# Patient Record
Sex: Female | Born: 1955 | Race: White | Hispanic: Yes | Marital: Married | Smoking: Never smoker
Health system: Southern US, Community
[De-identification: ages and names within clinical notes are randomized; demographics above are authoritative.]

## PROBLEM LIST (undated history)

## (undated) DIAGNOSIS — I10 Essential (primary) hypertension: Secondary | ICD-10-CM

## (undated) HISTORY — PX: ABDOMINAL HYSTERECTOMY: SHX81

## (undated) HISTORY — PX: TUBAL LIGATION: SHX77

---

## 2019-03-04 ENCOUNTER — Emergency Department (HOSPITAL_BASED_OUTPATIENT_CLINIC_OR_DEPARTMENT_OTHER)
Admission: EM | Admit: 2019-03-04 | Discharge: 2019-03-04 | Disposition: A | Discharge status: Home / Self Care | Payer: Self-pay | Attending: Emergency Medicine | Admitting: Emergency Medicine

## 2019-03-04 ENCOUNTER — Encounter (HOSPITAL_BASED_OUTPATIENT_CLINIC_OR_DEPARTMENT_OTHER): Payer: Self-pay | Admitting: Emergency Medicine

## 2019-03-04 ENCOUNTER — Other Ambulatory Visit: Payer: Self-pay

## 2019-03-04 ENCOUNTER — Emergency Department (HOSPITAL_BASED_OUTPATIENT_CLINIC_OR_DEPARTMENT_OTHER): Payer: Self-pay

## 2019-03-04 DIAGNOSIS — I10 Essential (primary) hypertension: Secondary | ICD-10-CM | POA: Insufficient documentation

## 2019-03-04 DIAGNOSIS — S60812A Abrasion of left wrist, initial encounter: Secondary | ICD-10-CM | POA: Diagnosis present

## 2019-03-04 DIAGNOSIS — Y999 Unspecified external cause status: Secondary | ICD-10-CM | POA: Insufficient documentation

## 2019-03-04 DIAGNOSIS — S51021A Laceration with foreign body of right elbow, initial encounter: Secondary | ICD-10-CM | POA: Insufficient documentation

## 2019-03-04 DIAGNOSIS — S80212A Abrasion, left knee, initial encounter: Secondary | ICD-10-CM | POA: Insufficient documentation

## 2019-03-04 DIAGNOSIS — S52532A Colles' fracture of left radius, initial encounter for closed fracture: Secondary | ICD-10-CM | POA: Insufficient documentation

## 2019-03-04 DIAGNOSIS — W19XXXA Unspecified fall, initial encounter: Secondary | ICD-10-CM

## 2019-03-04 DIAGNOSIS — W010XXA Fall on same level from slipping, tripping and stumbling without subsequent striking against object, initial encounter: Secondary | ICD-10-CM | POA: Insufficient documentation

## 2019-03-04 DIAGNOSIS — Y9289 Other specified places as the place of occurrence of the external cause: Secondary | ICD-10-CM | POA: Insufficient documentation

## 2019-03-04 DIAGNOSIS — Y93K1 Activity, walking an animal: Secondary | ICD-10-CM | POA: Insufficient documentation

## 2019-03-04 DIAGNOSIS — S51011A Laceration without foreign body of right elbow, initial encounter: Secondary | ICD-10-CM

## 2019-03-04 DIAGNOSIS — M79603 Pain in arm, unspecified: Secondary | ICD-10-CM

## 2019-03-04 HISTORY — DX: Essential (primary) hypertension: I10

## 2019-03-04 MED ORDER — SODIUM CHLORIDE 0.9 % IV BOLUS
1000.0000 mL | Freq: Once | INTRAVENOUS | Status: AC
  Administered 2019-03-04: 1000 mL via INTRAVENOUS

## 2019-03-04 MED ORDER — HYDROCODONE-ACETAMINOPHEN 5-325 MG PO TABS: 1.0000 | ORAL_TABLET | Freq: Four times a day (QID) | ORAL | 0 refills | Status: DC | PRN

## 2019-03-04 MED ORDER — HYDROCODONE-ACETAMINOPHEN 5-325 MG PO TABS
2.0000 | ORAL_TABLET | Freq: Once | ORAL | Status: DC
  Filled 2019-03-04: qty 2

## 2019-03-04 MED ORDER — TETANUS-DIPHTH-ACELL PERTUSSIS 5-2.5-18.5 LF-MCG/0.5 IM SUSP
0.5000 mL | Freq: Once | INTRAMUSCULAR | Status: AC
  Administered 2019-03-04: 0.5 mL via INTRAMUSCULAR
  Filled 2019-03-04: qty 0.5

## 2019-03-04 MED ORDER — DOXYCYCLINE HYCLATE 100 MG PO CAPS: 100.0000 mg | ORAL_CAPSULE | Freq: Two times a day (BID) | ORAL | 0 refills | Status: AC

## 2019-03-04 MED ORDER — PROPOFOL 10 MG/ML IV BOLUS
1.0000 mg/kg | Freq: Once | INTRAVENOUS | Status: DC
  Filled 2019-03-04: qty 20

## 2019-03-04 MED ORDER — PROPOFOL 10 MG/ML IV BOLUS
INTRAVENOUS | Status: AC | PRN
  Administered 2019-03-04 (×2): 30 mg via INTRAVENOUS
  Administered 2019-03-04: 50 mg via INTRAVENOUS
  Administered 2019-03-04: 20 mg via INTRAVENOUS

## 2019-03-04 MED ORDER — LIDOCAINE HCL (PF) 1 % IJ SOLN
5.0000 mL | Freq: Once | INTRAMUSCULAR | Status: AC
  Administered 2019-03-04: 5 mL
  Filled 2019-03-04: qty 5

## 2019-03-04 MED ORDER — ACETAMINOPHEN 325 MG PO TABS
650.0000 mg | ORAL_TABLET | Freq: Once | ORAL | Status: AC
  Administered 2019-03-04: 650 mg via ORAL
  Filled 2019-03-04: qty 2

## 2019-03-04 NOTE — ED Notes (Signed)
Pt ambulatory to BR with steady gate. No complaints at this time.

## 2019-03-04 NOTE — Sedation Documentation (Signed)
Unable to rate pain due to sedation.  

## 2019-03-04 NOTE — Sedation Documentation (Signed)
Pt rates pain 5/10.

## 2019-03-04 NOTE — ED Provider Notes (Signed)
  Physical Exam  BP (!) 146/88 (BP Location: Right Arm)   Pulse 72   Temp 98.4 F (36.9 C) (Oral)   Resp 18   Ht 5\' 3"  (1.6 m)   Wt 59 kg   SpO2 100%   BMI 23.03 kg/m    .Sedation Date/Time: 03/04/2019 6:29 PM Performed by: Pricilla Loveless, MD Authorized by: Pricilla Loveless, MD   Consent:    Consent obtained:  Verbal and written   Consent given by:  Patient   Risks discussed:  Allergic reaction, dysrhythmia, inadequate sedation, nausea, prolonged hypoxia resulting in organ damage, prolonged sedation necessitating reversal, respiratory compromise necessitating ventilatory assistance and intubation and vomiting   Alternatives discussed:  Analgesia without sedation, anxiolysis and regional anesthesia Universal protocol:    Procedure explained and questions answered to patient or proxy's satisfaction: yes     Relevant documents present and verified: yes     Test results available and properly labeled: yes     Imaging studies available: yes     Required blood products, implants, devices, and special equipment available: yes     Site/side marked: yes     Immediately prior to procedure a time out was called: yes     Patient identity confirmation method:  Verbally with patient Indications:    Procedure necessitating sedation performed by:  Physician performing sedation Pre-sedation assessment:    Time since last food or drink:  3.5 hours   ASA classification: class 1 - normal, healthy patient     Neck mobility: normal     Mouth opening:  3 or more finger widths   Thyromental distance:  4 finger widths   Mallampati score:  I - soft palate, uvula, fauces, pillars visible   Pre-sedation assessments completed and reviewed: airway patency, cardiovascular function, hydration status, mental status, nausea/vomiting, pain level, respiratory function and temperature   Immediate pre-procedure details:    Reassessment: Patient reassessed immediately prior to procedure     Reviewed: vital signs,  relevant labs/tests and NPO status     Verified: bag valve mask available, emergency equipment available, intubation equipment available, IV patency confirmed, oxygen available and suction available   Procedure details (see MAR for exact dosages):    Preoxygenation:  Nasal cannula   Sedation:  Propofol   Intra-procedure monitoring:  Blood pressure monitoring, cardiac monitor, continuous pulse oximetry, frequent LOC assessments, frequent vital sign checks and continuous capnometry   Intra-procedure events: none     Total Provider sedation time (minutes):  13 Post-procedure details:    Attendance: Constant attendance by certified staff until patient recovered     Recovery: Patient returned to pre-procedure baseline     Post-sedation assessments completed and reviewed: airway patency, cardiovascular function, hydration status, mental status, nausea/vomiting, pain level, respiratory function and temperature     Patient is stable for discharge or admission: yes     Patient tolerance:  Tolerated well, no immediate complications    MDM  Patient with displaced wrist fracture. NV intact. Reduced with propofol sedation. See PA note for reduction.      Pricilla Loveless, MD 03/04/19 [phone]

## 2019-03-04 NOTE — Sedation Documentation (Signed)
Rates pain 5/10

## 2019-03-04 NOTE — ED Provider Notes (Signed)
[facility] EMERGENCY DEPARTMENT Provider Note   CSN: [id number] Arrival date & time: 03/04/19  1545    History   Chief Complaint Chief Complaint  Patient presents with  . Fall    HPI Maria Mccann is a 63 y.o. female with only no medical condition of hypertension presenting today for left wrist pain and deformity following fall.  Patient states that she was walking her dogs with her husband when she tripped over 1 of the dogs following onto her left wrist.  Patient reports that she had immediate throbbing pain and noted deformity and present immediately to the ER.  Patient reports that her fall occurred approximately 15 minutes prior to ER arrival.  She states her pain has improved since onset with rest and splint application by triage team.  Patient does also report laceration to the right elbow as well as abrasions of the left knee and abrasion of the left wrist.  Of note patient denies head injury, loss of consciousness, blood thinner use, pain of the back/neck, chest or abdomen.  She reports taking 2 ibuprofen prior to arrival with some improvement of her symptoms.     HPI  Past Medical History:  Diagnosis Date  . Hypertension     Patient Active Problem List   Diagnosis Date Noted  . Abrasion of left wrist 03/04/2019    Past Surgical History:  Procedure Laterality Date  . ABDOMINAL HYSTERECTOMY       OB History   No obstetric history on file.      Home Medications    Prior to Admission medications   Medication Sig Start Date End Date Taking? Authorizing Provider  doxycycline (VIBRAMYCIN) 100 MG capsule Take 1 capsule (100 mg total) by mouth 2 (two) times daily for 7 days. 03/04/19 03/11/19  Harlene Salts A, PA-C  HYDROcodone-acetaminophen (NORCO/VICODIN) 5-325 MG tablet Take 1-2 tablets by mouth every 6 (six) hours as needed. 03/04/19   Bill Salinas, PA-C    Family History No family history on file.  Social History Social History   Tobacco Use   . Smoking status: Never Smoker  . Smokeless tobacco: Never Used  Substance Use Topics  . Alcohol use: Not on file  . Drug use: Not on file     Allergies   Patient has no known allergies.   Review of Systems Review of Systems  Constitutional: Negative.  Negative for chills and fever.  Musculoskeletal: Positive for arthralgias (Left wrist). Negative for back pain and neck pain.  Skin: Positive for wound.  Neurological: Negative for weakness, numbness and headaches.  All other systems reviewed and are negative.  Physical Exam Updated Vital Signs BP (!) 150/96   Pulse 70   Temp 98.4 F (36.9 C) (Oral)   Resp 16   Ht 5\' 3"  (1.6 m)   Wt 59 kg   SpO2 98%   BMI 23.03 kg/m   Physical Exam Constitutional:      General: She is not in acute distress.    Appearance: Normal appearance. She is well-developed. She is not ill-appearing or diaphoretic.  HENT:     Head: Normocephalic and atraumatic. No raccoon eyes or Battle's sign.     Jaw: There is normal jaw occlusion. No trismus.     Right Ear: Tympanic membrane, ear canal and external ear normal. No hemotympanum.     Left Ear: Tympanic membrane, ear canal and external ear normal. No hemotympanum.     Nose: Nose normal.  Mouth/Throat:     Mouth: Mucous membranes are moist.     Pharynx: Oropharynx is clear.  Eyes:     General: Vision grossly intact. Gaze aligned appropriately.     Extraocular Movements: Extraocular movements intact.     Conjunctiva/sclera: Conjunctivae normal.     Pupils: Pupils are equal, round, and reactive to light.  Neck:     Musculoskeletal: Normal range of motion. No spinous process tenderness or muscular tenderness.     Trachea: Trachea and phonation normal. No tracheal tenderness or tracheal deviation.  Cardiovascular:     Rate and Rhythm: Normal rate and regular rhythm.     Pulses:          Radial pulses are 2+ on the right side and 2+ on the left side.  Pulmonary:     Effort: Pulmonary effort  is normal. No accessory muscle usage or respiratory distress.     Breath sounds: Normal air entry.  Chest:     Chest wall: No tenderness.  Abdominal:     General: There is no distension.     Palpations: Abdomen is soft.     Tenderness: There is no abdominal tenderness. There is no guarding or rebound.  Musculoskeletal:     Right elbow: She exhibits laceration. She exhibits normal range of motion, no swelling and no deformity. No tenderness found.     Left wrist: She exhibits decreased range of motion, tenderness and deformity.     Comments: No midline C/T/L spinal tenderness to palpation, no paraspinal muscle tenderness, no deformity, crepitus, or step-off noted. No sign of injury to the neck or back. - Pain deformity decreased range of motion of the left wrist appears as Colles' fracture.  Small abrasion overlying left wrist, superficial does not communicate with fracture.  See picture attached - 2 linear parallel lacerations of the right elbow.  See picture attached - Superficial abrasions of the left knee. - Patient with full range of motion and appropriate strength to movements of all major joints apart from left wrist.  Skin:    General: Skin is warm and dry.     Capillary Refill: Capillary refill takes less than 2 seconds.       Neurological:     Mental Status: She is alert.     GCS: GCS eye subscore is 4. GCS verbal subscore is 5. GCS motor subscore is 6.     Comments: Speech is clear and goal oriented, follows commands Major Cranial nerves without deficit, no facial droop Moves extremities without ataxia, coordination intact  Psychiatric:        Behavior: Behavior normal.          ED Treatments / Results  Labs (all labs ordered are listed, but only abnormal results are displayed) Labs Reviewed - No data to display  EKG None  Radiology Dg Elbow 2 Views Right  Result Date: 03/04/2019 CLINICAL DATA:  Abrasion after fall.  Rule out foreign body. EXAM: RIGHT  ELBOW - 2 VIEW COMPARISON:  None. FINDINGS: An IV and is associated tubing overlie the right elbow. The soft tissue injury appears to be posterior to the olecranon on the lateral view. There is a punctate focus of high attenuation in the wound which could represent a small foreign body. Soft tissues are otherwise normal. No fractures, dislocations, or joint effusions identified. IMPRESSION: Tiny rounded region of high attenuation in the wound posterior to the proximal ulna on the lateral view is suspicious for a tiny foreign body. No  fractures or effusions noted. Electronically Signed   By: Gerome Sam III M.D   On: 03/04/2019 18:29   Dg Wrist 2 Views Left  Result Date: 03/04/2019 CLINICAL DATA:  Post reduction films. Distal radius fracture reduction. EXAM: LEFT WRIST - 2 VIEW COMPARISON:  None. FINDINGS: The patient is now in a cast. The displaced and angulated distal radial fracture is stable. The ulnar styloid fracture is stable. No other interval changes. IMPRESSION: The patient has been placed in a cast. The angulated and displaced distal radius fracture and ulnar styloid fracture are stable. Electronically Signed   By: Gerome Sam III M.D   On: 03/04/2019 18:27   Dg Wrist Complete Left  Result Date: 03/04/2019 CLINICAL DATA:  Larey Seat onto outstretched LEFT arm while walking the dog today, deformity and pain EXAM: LEFT WRIST - COMPLETE 3+ VIEW COMPARISON:  None FINDINGS: Osseous demineralization. Transverse metaphyseal fracture distal LEFT radius with dorsal displacement and dorsal tilt of distal radial articular surface. Probable fracture of ulnar styloid, not significantly displaced. Joint spaces otherwise preserved. No additional fracture, dislocation or bone destruction. IMPRESSION: Transverse displaced and angulated distal LEFT radial metaphyseal fracture. Probable nondisplaced ulnar styloid fracture. Electronically Signed   By: Ulyses Southward M.D.   On: 03/04/2019 16:11    Procedures  .Marland KitchenLaceration Repair Date/Time: 03/04/2019 7:53 PM Performed by: Bill Salinas, PA-C Authorized by: Bill Salinas, PA-C   Consent:    Consent obtained:  Verbal   Consent given by:  Patient   Risks discussed:  Infection, need for additional repair, nerve damage, poor wound healing, poor cosmetic result, pain, retained foreign body, tendon damage and vascular damage Anesthesia (see MAR for exact dosages):    Anesthesia method:  Local infiltration   Local anesthetic:  Lidocaine 1% w/o epi Laceration details:    Location:  Shoulder/arm   Shoulder/arm location:  R elbow   Length (cm):  3   Depth (mm):  5 Repair type:    Repair type:  Simple Pre-procedure details:    Preparation:  Patient was prepped and draped in usual sterile fashion and imaging obtained to evaluate for foreign bodies Exploration:    Wound exploration: wound explored through full range of motion and entire depth of wound probed and visualized     Wound extent: foreign bodies/material     Wound extent: no muscle damage noted, no nerve damage noted, no tendon damage noted, no underlying fracture noted and no vascular damage noted     Foreign bodies/material:  Grit/Dirt Treatment:    Area cleansed with:  Betadine   Amount of cleaning:  Extensive   Irrigation solution:  Sterile saline   Irrigation volume:  1L   Irrigation method:  Pressure wash   Visualized foreign bodies/material removed: yes   Skin repair:    Repair method:  Sutures   Suture size:  4-0   Suture material:  Prolene   Suture technique:  Simple interrupted   Number of sutures:  2 Approximation:    Approximation:  Close Post-procedure details:    Dressing:  Antibiotic ointment, non-adherent dressing and sterile dressing   Patient tolerance of procedure:  Tolerated well, no immediate complications .Marland KitchenLaceration Repair Date/Time: 03/04/2019 7:55 PM Performed by: Bill Salinas, PA-C Authorized by: Bill Salinas, PA-C   Consent:     Consent obtained:  Verbal   Consent given by:  Patient   Risks discussed:  Infection, need for additional repair, nerve damage, poor wound healing, pain, poor cosmetic result, tendon  damage, vascular damage and retained foreign body Anesthesia (see MAR for exact dosages):    Anesthesia method:  Local infiltration   Local anesthetic:  Lidocaine 1% w/o epi Laceration details:    Location:  Shoulder/arm   Shoulder/arm location:  R elbow   Length (cm):  3   Depth (mm):  5 Repair type:    Repair type:  Simple Pre-procedure details:    Preparation:  Patient was prepped and draped in usual sterile fashion and imaging obtained to evaluate for foreign bodies Exploration:    Hemostasis achieved with:  Direct pressure   Wound exploration: wound explored through full range of motion and entire depth of wound probed and visualized     Wound extent: foreign bodies/material     Wound extent: no muscle damage noted, no nerve damage noted, no tendon damage noted, no underlying fracture noted and no vascular damage noted     Foreign bodies/material:  Dirt/grit Treatment:    Area cleansed with:  Betadine   Amount of cleaning:  Extensive   Irrigation solution:  Sterile saline   Irrigation volume:  1L   Irrigation method:  Pressure wash   Visualized foreign bodies/material removed: yes   Skin repair:    Repair method:  Sutures   Suture size:  4-0   Suture material:  Prolene   Suture technique:  Simple interrupted   Number of sutures:  2 Approximation:    Approximation:  Close Post-procedure details:    Dressing:  Antibiotic ointment, non-adherent dressing and sterile dressing   Patient tolerance of procedure:  Tolerated well, no immediate complications .Ortho Injury Treatment Date/Time: 03/04/2019 7:57 PM Performed by: Bill SalinasMorelli, Vinson Tietze A, PA-C Authorized by: Bill SalinasMorelli, Coco Sharpnack A, PA-C   Consent:    Consent obtained:  Verbal   Consent given by:  Patient   Risks discussed:  Fracture, nerve  damage, restricted joint movement, vascular damage, stiffness, recurrent dislocation and irreducible dislocationInjury location: wrist Location details: left wrist Injury type: fracture Fracture type: distal radius and ulnar styloid Pre-procedure neurovascular assessment: neurovascularly intact Pre-procedure distal perfusion: normal Pre-procedure neurological function: normal Pre-procedure range of motion: reduced  Anesthesia: Local anesthesia used: no  Patient sedated: Yes. Refer to sedation procedure documentation for details of sedation. Manipulation performed: yes Reduction successful: yes X-ray confirmed reduction: yes Immobilization: splint Splint type: sugar tong Post-procedure neurovascular assessment: post-procedure neurovascularly intact Post-procedure distal perfusion: normal Post-procedure neurological function: normal Post-procedure range of motion: unchanged Patient tolerance: Patient tolerated the procedure well with no immediate complications Comments: Procedural sedation performed by Dr. Criss AlvineGoldston.  Reduction supervised by Dr. Criss AlvineGoldston.  Marland Kitchen.Splint Application Date/Time: 03/04/2019 7:59 PM Performed by: Bill SalinasMorelli, Gwynneth Fabio A, PA-C Authorized by: Bill SalinasMorelli, Leonilda Cozby A, PA-C   Consent:    Consent obtained:  Verbal   Consent given by:  Patient   Risks discussed:  Numbness, discoloration, pain and swelling Pre-procedure details:    Sensation:  Normal   Skin color:  Normal Procedure details:    Laterality:  Left   Location:  Wrist   Wrist:  L wrist   Splint type:  Sugar tong Post-procedure details:    Pain:  Improved   Sensation:  Normal   Skin color:  Normal   Patient tolerance of procedure:  Tolerated well, no immediate complications Comments:     Applied with ED tech.   (including critical care time)  Medications Ordered in ED Medications  propofol (DIPRIVAN) 10 mg/mL bolus/IV push 59 mg (130 mg Intravenous See Procedure Record 03/04/19 1828)  Tdap (  BOOSTRIX)  injection 0.5 mL (0.5 mLs Intramuscular Given 03/04/19 1831)  sodium chloride 0.9 % bolus 1,000 mL (0 mLs Intravenous Stopped 03/04/19 1900)  lidocaine (PF) (XYLOCAINE) 1 % injection 5 mL (5 mLs Infiltration Given by Other 03/04/19 1815)  propofol (DIPRIVAN) 10 mg/mL bolus/IV push (30 mg Intravenous Given 03/04/19 1752)  acetaminophen (TYLENOL) tablet 650 mg (650 mg Oral Given 03/04/19 1825)   Initial Impression / Assessment and Plan / ED Course  I have reviewed the triage vital signs and the nursing notes.  Pertinent labs & imaging results that were available during my care of the patient were reviewed by me and considered in my medical decision making (see chart for details).  Clinical Course as of Mar 04 1951  Sat Mar 04, 2019  1636 Discussed case with Dr. Renetta Chalk, ortho hand, who has asked that we reduce here in ED and splint for follow-up in his office on Tuesday.   [BM]    Clinical Course User Index [BM] Bill Salinas, PA-C   63 year old female presents with left wrist fracture following fall just prior to arrival.  Abrasion overlying left ridge which is superficial and does not communicate with fracture.  Additionally with 2 lacerations of the right elbow.  Abrasions of the left knee.  Patient denies loss of consciousness, headache, blood thinner use or pain of the neck back chest or abdomen.  No signs of injury other than above.  Patient's left upper extremity is neurovascularly intact on arrival and strong radial pulse, capillary refill and sensation intact to all fingers, full range of motion to all fingers, reduced range of motion of left wrist secondary to fracture no pain of the left elbow or shoulder.  Patient does endorse a tingling sensation of the tip of the left index finger, sensation is intact in all distributions.  DG left wrist:  IMPRESSION: Transverse displaced and angulated distal LEFT radial metaphyseal fracture. Probable nondisplaced ulnar styloid fracture.   Patient's  case was discussed with hand surgeon Dr. Orlan Leavens who advised that we reduce fracture here in the emergency department placed in splint and have patient follow-up in his office on Tuesday.  Procedural sedation was performed by Dr. Criss Alvine and fracture was reduced without complication.  Patient was placed in splint.  Postreduction films obtained.  DG left wrist: IMPRESSION: The patient has been placed in a cast. The angulated and displaced distal radius fracture and ulnar styloid fracture are stable.   Postreduction films were reviewed with Dr. Criss Alvine, improved alignment, no further manipulation indicated at this time.  Postreduction and splinting patient neurovascularly intact capillary refill and sensation is intact to all fingers in all distributions.  Patient reports that the tingling sensation of the left index finger has improved.  Plan of care regarding the left wrist fracture is to provide patient with small course of Norco for her acute fracture, narcotic precautions discussed with the patient and she states understanding.  Rice therapy and orthopedic follow-up on Tuesday, referral given.  Patient counseled on splint care. --------------- As to patient's to patient's two linear parallel right elbow lacerations.   DG right elbow:  IMPRESSION: Tiny rounded region of high attenuation in the wound posterior to the proximal ulna on the lateral view is suspicious for a tiny foreign body. No fractures or effusions noted.   Patient reports that she has not had tetanus shot in greater than 10 years, Tdap updated today.  Wounds were thoroughly cleaned here in the emergency department and multiple bits of  debris were removed from the lacerations.  Lacerations repaired as dictated above.  Based on mechanism and amount of contamination of these lacerations doxycycline was prescribed for infection prophylaxis.  Encourage patient to follow-up for wound recheck in 3-4 days and informed of need for suture  removal in 7 days.  Post laceration repair patient is with full range of motion of the right elbow without pain or dehiscence.  Dressed by nursing staff. - As to patient's left knee abrasions and superficial she has full range of motion and strength no bony tenderness, effusion or pain with range of motion, no imaging indicated of this area. - Patient counseled on home wound care. Follow up with PCP/urgent care or return to ER for suture removal in 7 days. Patient was urged to return to the Emergency Department for worsening pain, swelling, expanding erythema especially if it streaks away from the affected area, fever, or for any additional concerns. - At this time there does not appear to be any evidence of an acute emergency medical condition and the patient appears stable for discharge with appropriate outpatient follow up. Diagnosis was discussed with patient who verbalizes understanding of care plan and is agreeable to discharge. I have discussed return precautions with patient who verbalize understanding of return precautions. Patient encouraged to follow-up with their PCP and ortho. All questions answered.  Patient has been discharged in good condition.  Patient was seen and evaluated by Dr. Criss Alvine during this visit, case rediscussed with Dr. Criss Alvine at discharge who agrees with plan of care, discharge with primary and Ortho follow-up.   Note: Portions of this report may have been transcribed using voice recognition software. Every effort was made to ensure accuracy; however, inadvertent computerized transcription errors may still be present. Final Clinical Impressions(s) / ED Diagnoses   Final diagnoses:  Closed Colles' fracture of left radius, initial encounter  Elbow laceration, right, initial encounter  Abrasion of left wrist, initial encounter  Abrasion, left knee, initial encounter  Fall, initial encounter    ED Discharge Orders         Ordered    HYDROcodone-acetaminophen  (NORCO/VICODIN) 5-325 MG tablet  Every 6 hours PRN     03/04/19 1944    doxycycline (VIBRAMYCIN) 100 MG capsule  2 times daily     03/04/19 1948           Elizabeth Palau 03/04/19 2118    Pricilla Loveless, MD 03/04/19 2119

## 2019-03-04 NOTE — ED Notes (Signed)
Pt awakw and alert, speaking to family by phone

## 2019-03-04 NOTE — ED Notes (Signed)
Pt's son updated on plan of care (sedation and reduction of wrist fracture) per pt request

## 2019-03-04 NOTE — ED Triage Notes (Signed)
Pt tripped over her dog and fell injuring L wrist, deformity noted. Pt has strong pulse and neuro function is intact. Card board splint applied at triage. Denies LOC.

## 2019-03-04 NOTE — ED Notes (Signed)
ED Provider at bedside. Dr. Goldston 

## 2019-03-04 NOTE — Discharge Instructions (Addendum)
You have been diagnosed today with fracture of the left wrist with dislocation.  2 lacerations of the right elbow.  Abrasions of the left wrist and left knee.  At this time there does not appear to be the presence of an emergent medical condition, however there is always the potential for conditions to change. Please read and follow the below instructions.  Please return to the Emergency Department immediately for any new or worsening symptoms. Please be sure to follow up with your Primary Care Provider within one week regarding your visit today; please call their office to schedule an appointment even if you are feeling better for a follow-up visit. The hand surgeon Dr. Orlan Leavensrtman is expecting to see you in his office on Tuesday, 03/07/2019.  Please call his office on Monday to confirm your appointment.  Your wrist will need to be treated by the hand surgeon to ensure proper healing and restoration of function.  You may use the pain medication Norco as prescribed to help with severe pain; do not drive or operate heavy machinery or drink alcohol while taking Norco as it will make you drowsy.  Additionally rest, ice and elevating your left wrist will help with pain and swelling. Your 2 lacerations on your left elbow have been repaired with a total of 4 stitches.  These stitches will need to be removed in 7 days they may be removed by your primary care provider, the urgent care, the hand specialist or here at the emergency department.  Please take the antibiotic doxycycline as prescribed to avoid infection.  Follow-up is important for wound recheck.  Please change her dressing daily and monitor for signs of infection including redness, swelling, pain, drainage and fever.  Return to the emergency department if signs of infection develop.  Get help right away if: You have very bad swelling around your wound. You have pus or a bad smell coming from your wound. Your pain suddenly gets worse and is very bad. You  have painful lumps near your wound or anywhere on your body. You have a red streak going away from your wound. The wound is on your hand or foot, and: You cannot move a finger or toe as you used to do. Your fingers or toes look pale or blue. You have numbness that spreads down your hand, foot, fingers, or toes. Get help right away if: You cannot move your fingers. You have severe pain. Your fingers or your hand: Become numb, cold, or pale. Turn a bluish color. Get help right away if: Your pain is getting worse. The injured area tingles, becomes numb, or turns cold and blue. The part of your body above or below the cast is swollen and discolored. You cannot feel or move your fingers or toes. There is fluid leaking through the cast. You have severe pain or pressure under the cast. You have trouble breathing. You have shortness of breath. You have chest pain.  Please read the additional information packets attached to your discharge summary.  Do not take your medicine if  develop an itchy rash, swelling in your mouth or lips, or difficulty breathing.  --- Below has been translated using Google translate.  Errors may be present.  Interpret with caution.  ========= A continuacin se ha traducido AutoNationutilizando Google translate. Los errores The Timken Companypueden estar presentes. Interpretar con precaucin. ---- Vito BackersHoy le han diagnosticado fractura de la mueca izquierda con luxacin. 2 laceraciones del codo derecho. Abrasiones de la [place]ca izquierda y la rodilla izquierda.  En este momento no parece existir la presencia de una condicin mdica emergente, sin embargo, siempre existe la posibilidad de que las condiciones [place]. Lea y siga las instrucciones a continuacin.  1. Regrese al Departamento de emergencias de inmediato por cualquier sntoma nuevo o que empeore. 2. Asegrese de hacer un seguimiento con su proveedor de atencin primaria dentro de una semana con respecto a su visita de hoy; llame a su  oficina para programar una cita, incluso si se siente mejor para una visita de seguimiento. 3. El cirujano de [place] Dr. Salina April verte en su oficina el Karolee Stamps 03/07/2019. Llame a su oficina el lunes para confirmar su cita. El cirujano de la mano deber tratar Renne Crigler para garantizar una curacin [place] y la restauracin de la funcin. Puede usar el medicamento para Child psychotherapist segn lo prescrito para ayudar con el dolor intenso; No conduzca ni maneje maquinaria pesada ni beba alcohol mientras est tomando Norco, ya que lo adormecer. Adems, descansar, hielo y elevar la mueca izquierda ayudarn con el dolor y la hinchazn. 4. Sus 2 laceraciones en el codo izquierdo han sido reparadas con un total de 4 puntos. Estos puntos de sutura debern retirarse en 7 das; su proveedor de atencin primaria, la atencin de [place], Pensions consultant en manos o el servicio de urgencias pueden retirarlos. Tome el antibitico doxiciclina como se lo recetaron para evitar infecciones. El seguimiento es importante para volver a IT consultant herida. Cambie su vendaje diariamente y controle si hay signos de infeccin, como enrojecimiento, hinchazn, dolor, drenaje y [place]. Regrese al departamento de emergencias si se desarrollan signos de infeccin.  Obtenga ayuda de inmediato si: ? Tiene una hinchazn muy fuerte alrededor de la herida. ? Tiene pus o un mal olor proveniente de su herida. ? Su dolor de repente [place] y es [address] ? Tiene bultos dolorosos cerca de su herida o en cualquier parte de su cuerpo. ? Tiene una raya roja que se aleja de su herida. ? La herida est en su mano o pie y: o No puede mover un dedo o un dedo del pie como sola hacerlo. o Sus dedos de manos y pies se ven plidos o azules. o Usted tiene entumecimiento que se extiende por su mano, pie, dedos o dedos de los pies. Obtenga ayuda de inmediato si: ? No puedes mover tus dedos. ? Statistician. ? Tus dedos o tu mano: o Se  vuelve entumecido, fro o plido. o Gire un color azulado. Obtenga ayuda de inmediato si: ? Tu dolor est empeorando. ? El rea lesionada hormiguea, se adormece o se vuelve fra y [place]. ? La parte de su cuerpo encima o debajo del yeso est hinchada y descolorida. ? No puede sentir ni mover los dedos de las manos o los pies. ? Se est escapando lquido a travs del yeso. ? Tiene dolor intenso o presin debajo del yeso. ? Tienes problemas para respirar. ? Tienes dificultad para respirar. ? Tienes Journalist, newspaper.  Lea los paquetes de informacin adicional adjuntos a su resumen de alta.  No tome su medicamento si desarrolla una erupcin cutnea con picazn, hinchazn en la boca o los labios, o dificultad para respirar.

## 2019-03-04 NOTE — ED Notes (Signed)
Report given to [facility], [place]

## 2019-03-07 ENCOUNTER — Other Ambulatory Visit: Payer: Self-pay

## 2019-03-07 ENCOUNTER — Encounter (HOSPITAL_BASED_OUTPATIENT_CLINIC_OR_DEPARTMENT_OTHER): Payer: Self-pay | Admitting: *Deleted

## 2019-03-07 NOTE — H&P (Addendum)
  Maria Mccann is an 63 y.o. female.   Chief Complaint: LEFT WRIST INJURY  HPI: The patient is a 63 y/o right hand dominant female who fell on 03/04/19 causing an injury to the left wrist. She was seen at [facility] for initial treatment. She was put into a sugartong splint and sling.  She has had continued numbness, tingling, swelling, and stiffness since the injury. Discussed the reason and rationale for surgery.  The patient is here today for surgery.  She denies chest pain, shortness of breath, fever, chills, nausea, vomiting, or diarrhea.    Past Medical History:  Diagnosis Date  . Hypertension     Past Surgical History:  Procedure Laterality Date  . ABDOMINAL HYSTERECTOMY      No family history on file. Social History:  reports that she has never smoked. She has never used smokeless tobacco. No history on file for alcohol and drug.  Allergies: No Known Allergies  No medications prior to admission.    No results found for this or any previous visit (from the past 48 hour(s)). No results found.  ROS NO RECENT ILLNESSES OR HOSPITALIZATIONS  There were no vitals taken for this visit. Physical Exam  General Appearance:  Alert, cooperative, no distress, appears stated age  Head:  Normocephalic, without obvious abnormality, atraumatic  Eyes:  Pupils equal, conjunctiva/corneas clear,         Throat: Lips, mucosa, and tongue normal; teeth and gums normal  Neck: No visible masses     Lungs:   respirations unlabored  Chest Wall:  No tenderness or deformity  Heart:  Regular rate and rhythm,  Abdomen:   Soft, non-tender,         Extremities:   Pulses: 2+ and symmetric  Skin: Skin color, texture, turgor normal, no rashes or lesions     Neurologic: Normal    Assessment LEFT DISTAL RADIUS FRACTURE,DISPLACED ANGULATED, INTRAARTICULAR   Plan LEFT DISTAL RADIUS OPEN REDUCTION AND INTERNAL FIXATION WITH REPAIR AS INDICATED   WE ARE PLANNING SURGERY FOR YOUR  UPPER EXTREMITY. THE RISKS AND BENEFITS OF SURGERY INCLUDE BUT NOT LIMITED TO BLEEDING INFECTION, DAMAGE TO NEARBY NERVES ARTERIES TENDONS, FAILURE OF SURGERY TO ACCOMPLISH ITS INTENDED GOALS, PERSISTENT SYMPTOMS AND NEED FOR FURTHER SURGICAL INTERVENTION. WITH THIS IN MIND WE WILL PROCEED. I HAVE DISCUSSED WITH THE PATIENT THE PRE AND POSTOPERATIVE REGIMEN AND THE DOS AND DON'TS. PT VOICED UNDERSTANDING AND INFORMED CONSENT SIGNED.  R/B/A DISCUSSED WITH PT IN OFFICE.  PT VOICED UNDERSTANDING OF PLAN CONSENT SIGNED DAY OF SURGERY PT SEEN AND EXAMINED PRIOR TO OPERATIVE PROCEDURE/DAY OF SURGERY SITE MARKED. QUESTIONS ANSWERED WILL GO HOME FOLLOWING SURGERY  Dana Dorner [facility] MD 03/08/19  Karma Greaser 03/07/2019, 3:14 PM

## 2019-03-08 ENCOUNTER — Ambulatory Visit (HOSPITAL_BASED_OUTPATIENT_CLINIC_OR_DEPARTMENT_OTHER)
Admission: RE | Admit: 2019-03-08 | Discharge: 2019-03-08 | Disposition: A | Discharge status: Home / Self Care | Payer: Self-pay | Attending: Orthopedic Surgery | Admitting: Orthopedic Surgery

## 2019-03-08 ENCOUNTER — Ambulatory Visit (HOSPITAL_BASED_OUTPATIENT_CLINIC_OR_DEPARTMENT_OTHER): Payer: Self-pay | Admitting: Anesthesiology

## 2019-03-08 ENCOUNTER — Encounter (HOSPITAL_BASED_OUTPATIENT_CLINIC_OR_DEPARTMENT_OTHER): Payer: Self-pay | Admitting: *Deleted

## 2019-03-08 ENCOUNTER — Encounter (HOSPITAL_BASED_OUTPATIENT_CLINIC_OR_DEPARTMENT_OTHER)
Admission: RE | Disposition: A | Discharge status: Home / Self Care | Payer: Self-pay | Source: Home / Self Care | Attending: Orthopedic Surgery

## 2019-03-08 DIAGNOSIS — W19XXXA Unspecified fall, initial encounter: Secondary | ICD-10-CM | POA: Insufficient documentation

## 2019-03-08 DIAGNOSIS — I10 Essential (primary) hypertension: Secondary | ICD-10-CM | POA: Insufficient documentation

## 2019-03-08 DIAGNOSIS — S52502A Unspecified fracture of the lower end of left radius, initial encounter for closed fracture: Secondary | ICD-10-CM

## 2019-03-08 DIAGNOSIS — S52572A Other intraarticular fracture of lower end of left radius, initial encounter for closed fracture: Secondary | ICD-10-CM | POA: Insufficient documentation

## 2019-03-08 HISTORY — PX: OPEN REDUCTION INTERNAL FIXATION (ORIF) DISTAL RADIAL FRACTURE: SHX5989

## 2019-03-08 SURGERY — OPEN REDUCTION INTERNAL FIXATION (ORIF) DISTAL RADIUS FRACTURE
Anesthesia: General | Site: Wrist | Laterality: Left

## 2019-03-08 MED ORDER — FENTANYL CITRATE (PF) 100 MCG/2ML IJ SOLN
INTRAMUSCULAR | Status: AC
  Filled 2019-03-08: qty 2

## 2019-03-08 MED ORDER — MIDAZOLAM HCL 2 MG/2ML IJ SOLN
INTRAMUSCULAR | Status: AC
  Filled 2019-03-08: qty 2

## 2019-03-08 MED ORDER — OXYCODONE HCL 5 MG/5ML PO SOLN: 5.0000 mg | Freq: Once | ORAL | Status: DC | PRN

## 2019-03-08 MED ORDER — MEPERIDINE HCL 25 MG/ML IJ SOLN: 6.2500 mg | INTRAMUSCULAR | Status: DC | PRN

## 2019-03-08 MED ORDER — ACETAMINOPHEN 325 MG PO TABS: 325.0000 mg | ORAL_TABLET | ORAL | Status: DC | PRN

## 2019-03-08 MED ORDER — SCOPOLAMINE 1 MG/3DAYS TD PT72: 1.0000 | MEDICATED_PATCH | Freq: Once | TRANSDERMAL | Status: DC | PRN

## 2019-03-08 MED ORDER — ONDANSETRON HCL 4 MG/2ML IJ SOLN: 4.0000 mg | Freq: Once | INTRAMUSCULAR | Status: DC | PRN

## 2019-03-08 MED ORDER — FENTANYL CITRATE (PF) 100 MCG/2ML IJ SOLN: 25.0000 ug | INTRAMUSCULAR | Status: DC | PRN

## 2019-03-08 MED ORDER — EPHEDRINE SULFATE 50 MG/ML IJ SOLN
INTRAMUSCULAR | Status: DC | PRN
  Administered 2019-03-08: 10 mg via INTRAVENOUS

## 2019-03-08 MED ORDER — CEFAZOLIN SODIUM-DEXTROSE 2-4 GM/100ML-% IV SOLN: 2.0000 g | INTRAVENOUS | Status: DC

## 2019-03-08 MED ORDER — MIDAZOLAM HCL 2 MG/2ML IJ SOLN
1.0000 mg | INTRAMUSCULAR | Status: DC | PRN
  Administered 2019-03-08: 2 mg via INTRAVENOUS

## 2019-03-08 MED ORDER — OXYCODONE HCL 5 MG PO TABS: 5.0000 mg | ORAL_TABLET | Freq: Once | ORAL | Status: DC | PRN

## 2019-03-08 MED ORDER — ONDANSETRON HCL 4 MG/2ML IJ SOLN
INTRAMUSCULAR | Status: DC | PRN
  Administered 2019-03-08: 4 mg via INTRAVENOUS

## 2019-03-08 MED ORDER — PHENYLEPHRINE HCL (PRESSORS) 10 MG/ML IV SOLN
INTRAVENOUS | Status: DC | PRN
Start: 2019-03-08 — End: 2019-03-08
  Administered 2019-03-08 (×5): 120 ug via INTRAVENOUS

## 2019-03-08 MED ORDER — FENTANYL CITRATE (PF) 100 MCG/2ML IJ SOLN
50.0000 ug | INTRAMUSCULAR | Status: DC | PRN
  Administered 2019-03-08: 50 ug via INTRAVENOUS

## 2019-03-08 MED ORDER — LACTATED RINGERS IV SOLN
INTRAVENOUS | Status: DC
  Administered 2019-03-08: 10 mL/h via INTRAVENOUS

## 2019-03-08 MED ORDER — PROPOFOL 10 MG/ML IV BOLUS
INTRAVENOUS | Status: DC | PRN
  Administered 2019-03-08: 200 mg via INTRAVENOUS

## 2019-03-08 MED ORDER — ACETAMINOPHEN 160 MG/5ML PO SOLN: 325.0000 mg | ORAL | Status: DC | PRN

## 2019-03-08 MED ORDER — LIDOCAINE HCL (CARDIAC) PF 100 MG/5ML IV SOSY
PREFILLED_SYRINGE | INTRAVENOUS | Status: DC | PRN
  Administered 2019-03-08: 200 mg via INTRAVENOUS

## 2019-03-08 MED ORDER — BUPIVACAINE-EPINEPHRINE (PF) 0.5% -1:200000 IJ SOLN
INTRAMUSCULAR | Status: DC | PRN
  Administered 2019-03-08: 20 mL via PERINEURAL

## 2019-03-08 MED ORDER — CEFAZOLIN SODIUM-DEXTROSE 2-3 GM-%(50ML) IV SOLR
INTRAVENOUS | Status: DC | PRN
Start: 2019-03-08 — End: 2019-03-08
  Administered 2019-03-08: 2 g via INTRAVENOUS

## 2019-03-08 MED ORDER — CHLORHEXIDINE GLUCONATE 4 % EX LIQD: 60.0000 mL | Freq: Once | CUTANEOUS | Status: DC

## 2019-03-08 MED ORDER — DEXAMETHASONE SODIUM PHOSPHATE 10 MG/ML IJ SOLN
INTRAMUSCULAR | Status: DC | PRN
  Administered 2019-03-08: 5 mg via INTRAVENOUS

## 2019-03-08 MED ORDER — BUPIVACAINE LIPOSOME 1.3 % IJ SUSP
INTRAMUSCULAR | Status: DC | PRN
  Administered 2019-03-08: 10 mL via PERINEURAL

## 2019-03-08 MED ORDER — PROPOFOL 10 MG/ML IV BOLUS
INTRAVENOUS | Status: AC
  Filled 2019-03-08: qty 40

## 2019-03-08 SURGICAL SUPPLY — 74 items
BANDAGE ACE 3X5.8 VEL STRL LF (GAUZE/BANDAGES/DRESSINGS) ×3 IMPLANT
BANDAGE ACE 4X5 VEL STRL LF (GAUZE/BANDAGES/DRESSINGS) ×3 IMPLANT
BIT DRILL 2.2 SS TIBIAL (BIT) ×3 IMPLANT
BLADE SURG 15 STRL LF DISP TIS (BLADE) ×2 IMPLANT
BLADE SURG 15 STRL SS (BLADE) ×4
BNDG ESMARK 4X9 LF (GAUZE/BANDAGES/DRESSINGS) ×3 IMPLANT
BNDG GAUZE ELAST 4 BULKY (GAUZE/BANDAGES/DRESSINGS) ×3 IMPLANT
CANISTER SUCT 1200ML W/VALVE (MISCELLANEOUS) IMPLANT
CORD BIPOLAR FORCEPS 12FT (ELECTRODE) ×3 IMPLANT
COVER BACK TABLE REUSABLE LG (DRAPES) ×3 IMPLANT
COVER WAND RF STERILE (DRAPES) IMPLANT
CUFF TOURN SGL QUICK 18X4 (TOURNIQUET CUFF) ×3 IMPLANT
DECANTER SPIKE VIAL GLASS SM (MISCELLANEOUS) IMPLANT
DRAPE EXTREMITY T 121X128X90 (DISPOSABLE) ×3 IMPLANT
DRAPE OEC MINIVIEW 54X84 (DRAPES) ×3 IMPLANT
DRSG EMULSION OIL 3X3 NADH (GAUZE/BANDAGES/DRESSINGS) ×3 IMPLANT
GAUZE SPONGE 4X4 12PLY STRL (GAUZE/BANDAGES/DRESSINGS) ×3 IMPLANT
GLOVE BIO SURGEON STRL SZ8 (GLOVE) ×3 IMPLANT
GLOVE BIOGEL PI IND STRL 6.5 (GLOVE) ×2 IMPLANT
GLOVE BIOGEL PI IND STRL 7.5 (GLOVE) ×1 IMPLANT
GLOVE BIOGEL PI IND STRL 8 (GLOVE) ×1 IMPLANT
GLOVE BIOGEL PI IND STRL 8.5 (GLOVE) ×1 IMPLANT
GLOVE BIOGEL PI INDICATOR 6.5 (GLOVE) ×4
GLOVE BIOGEL PI INDICATOR 7.5 (GLOVE) ×2
GLOVE BIOGEL PI INDICATOR 8 (GLOVE) ×2
GLOVE BIOGEL PI INDICATOR 8.5 (GLOVE) ×2
GLOVE ECLIPSE 6.5 STRL STRAW (GLOVE) ×6 IMPLANT
GLOVE SURG ORTHO 8.0 STRL STRW (GLOVE) ×3 IMPLANT
GOWN STRL REUS W/ TWL XL LVL3 (GOWN DISPOSABLE) ×1 IMPLANT
GOWN STRL REUS W/TWL XL LVL3 (GOWN DISPOSABLE) ×2
K-WIRE 1.6 (WIRE) ×2
K-WIRE FX5X1.6XNS BN SS (WIRE) ×1
KWIRE FX5X1.6XNS BN SS (WIRE) ×1 IMPLANT
NEEDLE HYPO 25X1 1.5 SAFETY (NEEDLE) IMPLANT
NS IRRIG 1000ML POUR BTL (IV SOLUTION) ×3 IMPLANT
PACK BASIN DAY SURGERY FS (CUSTOM PROCEDURE TRAY) ×3 IMPLANT
PAD CAST 4YDX4 CTTN HI CHSV (CAST SUPPLIES) ×2 IMPLANT
PADDING CAST ABS 4INX4YD NS (CAST SUPPLIES) ×2
PADDING CAST ABS COTTON 4X4 ST (CAST SUPPLIES) ×1 IMPLANT
PADDING CAST COTTON 4X4 STRL (CAST SUPPLIES) ×4
PEG LOCKING SMOOTH 2.2X16 (Screw) ×3 IMPLANT
PEG LOCKING SMOOTH 2.2X18 (Peg) ×3 IMPLANT
PEG LOCKING SMOOTH 2.2X20 (Screw) ×9 IMPLANT
PEG LOCKING SMOOTH 2.2X22 (Screw) ×6 IMPLANT
PLATE STANDARD DVR LEFT (Plate) ×3 IMPLANT
PLATE STD DVR LT 24X51 (Plate) ×1 IMPLANT
SCREW LOCK 14X2.7X 3 LD TPR (Screw) ×3 IMPLANT
SCREW LOCKING 2.7X13MM (Screw) ×6 IMPLANT
SCREW LOCKING 2.7X14 (Screw) ×6 IMPLANT
SLEEVE SCD COMPRESS KNEE MED (MISCELLANEOUS) ×3 IMPLANT
SLING ARM FOAM STRAP LRG (SOFTGOODS) IMPLANT
SLING ARM MED ADULT FOAM STRAP (SOFTGOODS) ×3 IMPLANT
SPLINT FIBERGLASS 3X35 (CAST SUPPLIES) ×3 IMPLANT
SPLINT FIBERGLASS 4X30 (CAST SUPPLIES) IMPLANT
STOCKINETTE 4X48 STRL (DRAPES) ×3 IMPLANT
SUCTION FRAZIER HANDLE 10FR (MISCELLANEOUS) ×2
SUCTION TUBE FRAZIER 10FR DISP (MISCELLANEOUS) ×1 IMPLANT
SUT MNCRL AB 3-0 PS2 18 (SUTURE) IMPLANT
SUT MON AB 3-0 SH 27 (SUTURE)
SUT MON AB 3-0 SH27 (SUTURE) IMPLANT
SUT PROLENE 3 0 PS 1 (SUTURE) IMPLANT
SUT PROLENE 4 0 PS 2 18 (SUTURE) ×3 IMPLANT
SUT VIC AB 0 CT1 27 (SUTURE) ×2
SUT VIC AB 0 CT1 27XBRD ANBCTR (SUTURE) ×1 IMPLANT
SUT VIC AB 2-0 PS2 27 (SUTURE) ×3 IMPLANT
SUT VIC AB 2-0 SH 27 (SUTURE)
SUT VIC AB 2-0 SH 27XBRD (SUTURE) IMPLANT
SUT VICRYL 4-0 PS2 18IN ABS (SUTURE) ×3 IMPLANT
SYR BULB 3OZ (MISCELLANEOUS) ×3 IMPLANT
SYR CONTROL 10ML LL (SYRINGE) IMPLANT
TOWEL GREEN STERILE FF (TOWEL DISPOSABLE) ×3 IMPLANT
TUBE CONNECTING 20'X1/4 (TUBING) ×1
TUBE CONNECTING 20X1/4 (TUBING) ×2 IMPLANT
UNDERPAD 30X30 (UNDERPADS AND DIAPERS) ×3 IMPLANT

## 2019-03-08 NOTE — Anesthesia Procedure Notes (Signed)
Anesthesia Regional Block: Interscalene brachial plexus block   Pre-Anesthetic Checklist: ,, timeout performed, Correct Patient, Correct Site, Correct Laterality, Correct Procedure, Correct Position, site marked, Risks and benefits discussed,  Surgical consent,  Pre-op evaluation,  At surgeon's request and post-op pain management  Laterality: Left  Prep: chloraprep       Needles:  Injection technique: Single-shot  Needle Type: Echogenic Stimulator Needle     Needle Length: 5cm  Needle Gauge: 22     Additional Needles:   Procedures:, nerve stimulator,,, ultrasound used (permanent image in chart),,,,   Nerve Stimulator or Paresthesia:  Response: hand, 0.45 mA,   Additional Responses:   Narrative:  Start time: 03/08/2019 12:30 PM End time: 03/08/2019 12:35 PM Injection made incrementally with aspirations every 5 mL.  Performed by: Personally  Anesthesiologist: Bethena Midget, MD  Additional Notes: Functioning IV was confirmed and monitors were applied.  A 61mm 22ga Arrow echogenic stimulator needle was used. Sterile prep and drape,hand hygiene and sterile gloves were used. Ultrasound guidance: relevant anatomy identified, needle position confirmed, local anesthetic spread visualized around nerve(s)., vascular puncture avoided.  Image printed for medical record. Negative aspiration and negative test dose prior to incremental administration of local anesthetic. The patient tolerated the procedure well.

## 2019-03-08 NOTE — Anesthesia Procedure Notes (Signed)
Procedure Name: LMA Insertion Performed by: Maresha Anastos M, CRNA Pre-anesthesia Checklist: Patient identified, Emergency Drugs available, Suction available, Patient being monitored and Timeout performed Patient Re-evaluated:Patient Re-evaluated prior to induction Oxygen Delivery Method: Circle system utilized Preoxygenation: Pre-oxygenation with 100% oxygen Induction Type: IV induction LMA: LMA inserted LMA Size: 3.0 Tube type: Oral Number of attempts: 1 Placement Confirmation: positive ETCO2,  CO2 detector and breath sounds checked- equal and bilateral Tube secured with: Tape Dental Injury: Teeth and Oropharynx as per pre-operative assessment        

## 2019-03-08 NOTE — Op Note (Signed)
PREOPERATIVE DIAGNOSIS:Leftwrist intra-articular distal radius fracture 3 more fragments  POSTOPERATIVE DIAGNOSIS:Same  ATTENDING SURGEON:Dr. Bradly Bienenstock who scrubbed and present for the entire procedure  ASSISTANT SURGEON:[facility] who is scrubbed and necessary for reduction application internal fixation closure and splinting in a timely fashion  ANESTHESIA:Regional block withgeneral  OPERATIVE PROCEDURE: #1: Open treatment ofleftwrist intra-articular distal radius fracture 2 more fragments #2:Leftwrist brachial radialis tendon tenotomy and release #3: Radiographs 3 viewsleftwrist  IMPLANTS:Biomet standard DVR cross lock  RADIOGRAPHIC INTERPRETATION:AP lateral and oblique views of the wrist do show the volar plate fixation in place in good position  SURGICAL INDICATIONS:Patient is a right-hand-dominant female who sustained the closedleftdistal radius fracture. Patient had the intra-articular displacement and angulation is recommended that she undergo the above procedure. Risks of surgery include but not limited to bleeding infection damage nearby nerves arteries or tendons loss of motion of the wrist and digits incomplete relief of symptoms and need for further surgical intervention.  SURGICAL TECHNIQUE:Patient was palpated and found the preoperative holding area to mark the permanent marker made on theleftwrist indicate the correct operative site. Patient brought back to operating placed supine on the anesthesia table where the regionalandIV sedationwas administered. Patient tolerated this well. Well-padded tourniquet was then placed on the leftbrachium and sealedwith the appropriate drape. Leftupper extremity wasthen prepped and draped normal sterile fashion. Preoperative antibiotics were given prior to any skin incision. The leftupper extremity wasthen prepped and draped normal sterile fashion. A timeout was called the correct site  was identified the procedure then begun. Attention was then turned to the leftwrist. The limb was elevated tourniquet insufflated. A longitudinal incision made directly over the FCR. The FCR sheath was then opened proximally and distally. Crossing venous vessels were ligated and cauterized with the bipolar cautery. The FCR sheath was opened proximally distally. The FPL was swept out of the way and the pronator quadratus was opened in an L-shaped fashion. The brachioradialis was then carefully elevated off the radial styloid several procedures in order to regain reduction of the radial column. Tendon tenotomy and release of the brachioradialis was done. Open reduction was then performed. This was a comminuted fracture intra-articular fracture 2 more fragments.The wound was then thoroughly irrigated. The volar plate was then applied was held distally with a K wire position was confirmed using the mini C arm. The oblong screw hole was placed proximally. Distal fixation was then carried out from an ulnar to radial direction with distal locking pegs.Shaft fixation was completed locking and nonlocking screws. Final radiographs were then obtained. The wound was then thoroughly irrigated. The pronator quadratus was closed with 2-0 Vicryl. The subcutaneous tissues closed with 4-0 Vicryl. Skin then closed with a simple Prolene sutures. Adaptic dressing and a sterile compressive bandage then applied patient was then placed in a well-padded sugar tong splint. Patient was taken recovery in good condition.  POSTOPERATIVE PLAN:Patient be discharged to home. Seeherback in the office 15 days for wound check suture removal x-rays application of a short arm cast. Put in a therapy order for the 4-week mark. Begin outpatient therapy at the 4-week mark. Cast off the 4-week mark. Radiographs at each visit.

## 2019-03-08 NOTE — Transfer of Care (Signed)
Immediate Anesthesia Transfer of Care Note  Patient: Maria Mccann  Procedure(s) Performed: LEFT DISTAL RADIUS REPAIR / RECONSTRUCTION (Left Wrist)  Patient Location: PACU  Anesthesia Type:General and Regional  Level of Consciousness: awake, alert  and oriented  Airway & Oxygen Therapy: Patient Spontanous Breathing and Patient connected to face mask oxygen  Post-op Assessment: Report given to RN and Post -op Vital signs reviewed and stable  Post vital signs: Reviewed and stable  Last Vitals:  Vitals Value Taken Time  BP    Temp    Pulse    Resp    SpO2      Last Pain:  Vitals:   03/08/19 1235  TempSrc:   PainSc: 0-No pain      Patients Stated Pain Goal: 2 (03/08/19 1204)  Complications: No apparent anesthesia complications

## 2019-03-08 NOTE — Anesthesia Preprocedure Evaluation (Signed)
Anesthesia Evaluation  Patient identified by MRN, date of birth, ID band Patient awake    Reviewed: Allergy & Precautions, H&P , NPO status , Patient's Chart, lab work & pertinent test results  Airway Mallampati: II  TM Distance: >3 FB Neck ROM: Full    Dental no notable dental hx.    Pulmonary neg pulmonary ROS,    Pulmonary exam normal breath sounds clear to auscultation       Cardiovascular hypertension, negative cardio ROS Normal cardiovascular exam Rhythm:Regular Rate:Normal     Neuro/Psych negative neurological ROS  negative psych ROS   GI/Hepatic negative GI ROS, Neg liver ROS,   Endo/Other  negative endocrine ROS  Renal/GU negative Renal ROS  negative genitourinary   Musculoskeletal negative musculoskeletal ROS (+)   Abdominal   Peds negative pediatric ROS (+)  Hematology negative hematology ROS (+)   Anesthesia Other Findings   Reproductive/Obstetrics negative OB ROS                             Anesthesia Physical Anesthesia Plan  ASA: II  Anesthesia Plan: General   Post-op Pain Management:    Induction: Intravenous  PONV Risk Score and Plan: 3 and Ondansetron, Treatment may vary due to age or medical condition, Midazolam and Dexamethasone  Airway Management Planned: LMA and Oral ETT  Additional Equipment:   Intra-op Plan:   Post-operative Plan:   Informed Consent:   Plan Discussed with: CRNA, Surgeon and Anesthesiologist  Anesthesia Plan Comments: ( )        Anesthesia Quick Evaluation

## 2019-03-08 NOTE — Anesthesia Postprocedure Evaluation (Signed)
Anesthesia Post Note  Patient: Maria Mccann  Procedure(s) Performed: LEFT DISTAL RADIUS REPAIR / RECONSTRUCTION (Left Wrist)     Patient location during evaluation: PACU Anesthesia Type: General Level of consciousness: awake and alert Pain management: pain level controlled Vital Signs Assessment: post-procedure vital signs reviewed and stable Respiratory status: spontaneous breathing, nonlabored ventilation, respiratory function stable and patient connected to nasal cannula oxygen Cardiovascular status: blood pressure returned to baseline and stable Postop Assessment: no apparent nausea or vomiting Anesthetic complications: no    Last Vitals:  Vitals:   03/08/19 1415 03/08/19 1424  BP: 126/78   Pulse: 92 92  Resp: 16 16  Temp:    SpO2: 96% 96%    Last Pain:  Vitals:   03/08/19 1424  TempSrc:   PainSc: 0-No pain                 Kiowa Hollar

## 2019-03-08 NOTE — Discharge Instructions (Signed)
Cuidados del yeso o de la frula en los adultos (Cast or Splint Care, Adult) Los yesos y las frulas son soportes que se utilizan para proteger los [place] rotos y Management consultant. Un yeso o una frula mantienen el hueso firme y en la posicin correcta [place] se [place]. Los yesos y las frulas tambin ayudan a Engineer, materials, la hinchazn y los calambres. Un yeso es un soporte duro que suele estar hecho de fibra de vidrio o plstico. Est hecho a la medida del cuerpo y ofrece ms proteccin que una frula. No se puede quitar y volver a Biochemist, clinical. Una frula es un soporte blando que suele estar hecho de tela y elstico. Se puede ajustar y quitar segn sea necesario. Es posible que necesite un yeso o una frula si:  Tiene un hueso roto.  Tiene una lesin de las partes blandas.  Debe evitar mover una parte del cuerpo lesionada (dejarla inmvil) despus de [place]. CMO CUIDAR UN YESO O UNA FRULA     Si tiene un yeso:  No introduzca nada adentro del yeso para rascarse la piel. Hacerlo aumentar el riesgo de provocar una infeccin.  Controle todos los [employer] piel de alrededor del yeso. Informe al mdico acerca de cualquier inquietud.  Puede aplicar una locin en la piel seca alrededor de los bordes del yeso. No aplique locin en la piel por debajo del yeso.  Mantenga el yeso limpio.  Si el yeso no es impermeable: ? No deje que se moje. ? Cbralo con un envoltorio hermtico cuando tome un bao de inmersin o [place]. Si tiene una frula:  sela como se lo haya indicado el mdico. Qutesela solamente como se lo haya indicado el mdico.  Afloje la frula si los dedos de las manos o de los pies se le entumecen, siente hormigueos o se le enfran y se tornan de Research officer, trade union.  Mantenga la frula limpia.  Si la frula no es impermeable: ? No deje que se moje. ? Cbrala con un envoltorio hermtico cuando tome un bao de inmersin o [place]. El bao  No tome baos de inmersin  ni nade hasta que el mdico lo autorice. Pregntele al mdico si puede ducharse. Delle Reining solo le permitan tomar baos de [place].  Si el yeso o la frula no son impermeables, cbralos con un envoltorio hermtico cuando tome un bao de inmersin o una ducha. Control del dolor, de la rigidez y de la hinchazn  [employer] dedos de la mano o del pie con frecuencia para evitar la rigidez y reducir la hinchazn.  Cuando est sentado o acostado, eleve la zona de la lesin por encima del nivel del corazn. Seguridad  No apoye el peso del cuerpo sobre la extremidad [employer] que el mdico lo autorice.  Use muletas u otros dispositivos de [employer] se lo haya indicado el mdico. Instrucciones generales  No ejerza presin en ninguna parte del yeso o de la frula hasta que se hayan endurecido. Esto puede tardar varias horas.  Retome sus actividades normales como se lo haya indicado el mdico. Pregntele al mdico qu actividades son seguras para usted.  Tome los medicamentos de venta libre y los recetados solamente como se lo haya indicado el mdico.  Oceanographer a todas las visitas de control como se lo haya indicado el mdico. Esto es importante. SOLICITE ATENCIN MDICA SI:  El yeso o la frula se daan.  La piel alrededor del yeso se enrojece o est  en carne viva.  La piel debajo del yeso le pica o le duele mucho.  El yeso o la frula se sienten muy incmodos.  Siente que el yeso o la frula estn muy apretados o muy flojos.  El yeso se moja o tiene una zona blanda.  Algn objeto se queda atascado bajo el yeso. SOLICITE ATENCIN MDICA DE INMEDIATO SI:  El dolor empeora.  Siente hormigueo o entumecimiento en la zona lesionada, o esta se le enfra o se torna de color azul.  La parte del cuerpo por encima o por debajo del yeso est hinchada o tiene manchas.  No puede mover ni sentir los dedos.  Le sale una secrecin por el yeso.  Siente un dolor o presin intensos debajo del  yeso.  Tiene dificultad para respirar.  Le falta el aire.  Siente dolor en el pecho. Esta informacin no tiene Theme park managercomo fin reemplazar el consejo del mdico. Asegrese de hacerle al mdico cualquier pregunta que tenga. Document Released: 10/19/2005 Document Revised: 08/09/2013 Document Reviewed: 04/11/2016 Elsevier Interactive Patient Education  2019 Elsevier Inc. KEEP BANDAGE CLEAN AND DRY CALL OFFICE FOR F/U APPT [phone] in 15 days [facility] [address] KEEP HAND ELEVATED ABOVE HEART OK TO APPLY ICE TO OPERATIVE AREA CONTACT OFFICE IF ANY WORSENING PAIN OR CONCERNS.   Post Anesthesia Home Care Instructions  Activity: Get plenty of rest for the remainder of the day. A responsible individual must stay with you for 24 hours following the procedure.  For the next 24 hours, DO NOT: -Drive a car -Advertising copywriterperate machinery -Drink alcoholic beverages -Take any medication unless instructed by your physician -Make any legal decisions or sign important papers.  Meals: Start with liquid foods such as gelatin or soup. Progress to regular foods as tolerated. Avoid greasy, spicy, heavy foods. If nausea and/or vomiting occur, drink only clear liquids until the nausea and/or vomiting subsides. Call your physician if vomiting continues.  Special Instructions/Symptoms: Your throat may feel dry or sore from the anesthesia or the breathing tube placed in your throat during surgery. If this causes discomfort, gargle with warm salt water. The discomfort should disappear within 24 hours.  If you had a scopolamine patch placed behind your ear for the management of post- operative nausea and/or vomiting:  1. The medication in the patch is effective for 72 hours, after which it should be removed.  Wrap patch in a tissue and discard in the trash. Wash hands thoroughly with soap and water. 2. You may remove the patch earlier than 72 hours if you experience unpleasant side effects which may include dry  mouth, dizziness or visual disturbances. 3. Avoid touching the patch. Wash your hands with soap and water after contact with the patch.    Regional Anesthesia Blocks  1. Numbness or the inability to move the "blocked" extremity may last from 3-48 hours after placement. The length of time depends on the medication injected and your individual response to the medication. If the numbness is not going away after 48 hours, call your surgeon.  2. The extremity that is blocked will need to be protected until the numbness is gone and the  Strength has returned. Because you cannot feel it, you will need to take extra care to avoid injury. Because it may be weak, you may have difficulty moving it or using it. You may not know what position it is in without looking at it while the block is in effect.  3. For blocks in the  legs and feet, returning to weight bearing and walking needs to be done carefully. You will need to wait until the numbness is entirely gone and the strength has returned. You should be able to move your leg and foot normally before you try and bear weight or walk. You will need someone to be with you when you first try to ensure you do not fall and possibly risk injury.  4. Bruising and tenderness at the needle site are common side effects and will resolve in a few days.  5. Persistent numbness or new problems with movement should be communicated to the surgeon or the [facility] [phone] [facility] [phone]).  Information for Discharge Teaching: EXPAREL (bupivacaine liposome injectable suspension)   Your surgeon or anesthesiologist gave you EXPAREL(bupivacaine) to help control your pain after surgery.   EXPAREL is a local anesthetic that provides pain relief by numbing the tissue around the surgical site.  EXPAREL is designed to release pain medication over time and can control pain for up to 72 hours.  Depending on how you respond to EXPAREL,  you may require less pain medication during your recovery.  Possible side effects:  Temporary loss of sensation or ability to move in the area where bupivacaine was injected.  Nausea, vomiting, constipation  Rarely, numbness and tingling in your mouth or lips, lightheadedness, or anxiety may occur.  Call your doctor right away if you think you may be experiencing any of these sensations, or if you have other questions regarding possible side effects.  Follow all other discharge instructions given to you by your surgeon or nurse. Eat a healthy diet and drink plenty of water or other fluids.  If you return to the hospital for any reason within 96 hours following the administration of EXPAREL, it is important for health care providers to know that you have received this anesthetic. A teal colored band has been placed on your arm with the date, time and amount of EXPAREL you have received in order to alert and inform your health care providers. Please leave this armband in place for the full 96 hours following administration, and then you may remove the band.

## 2019-03-08 NOTE — Progress Notes (Signed)
Assisted Dr. Oddono with left, ultrasound guided, supraclavicular block. Side rails up, monitors on throughout procedure. See vital signs in flow sheet. Tolerated Procedure well. 

## 2019-03-13 ENCOUNTER — Encounter (HOSPITAL_BASED_OUTPATIENT_CLINIC_OR_DEPARTMENT_OTHER): Payer: Self-pay | Admitting: Orthopedic Surgery

## 2021-01-29 IMAGING — DX LEFT WRIST - COMPLETE 3+ VIEW
4 series · 4 of 4 positions shown · non-contrast
Comparison: None

CLINICAL DATA: Fell onto outstretched LEFT arm while walking the
dog today, deformity and pain

EXAM:
LEFT WRIST - COMPLETE 3+ VIEW

[wrist pa]
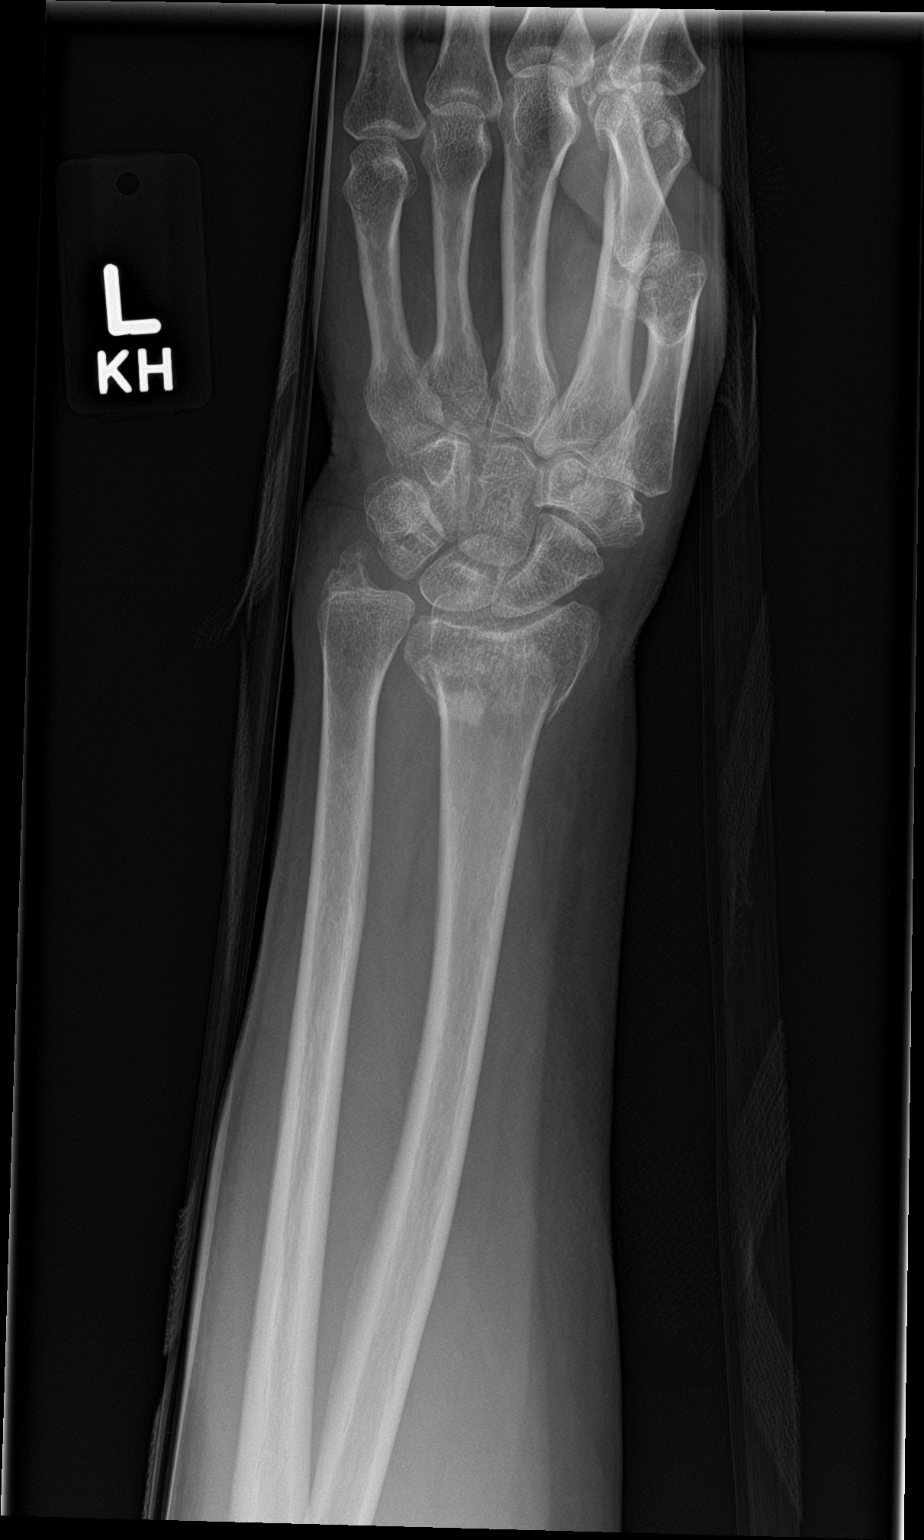

[wrist obl]
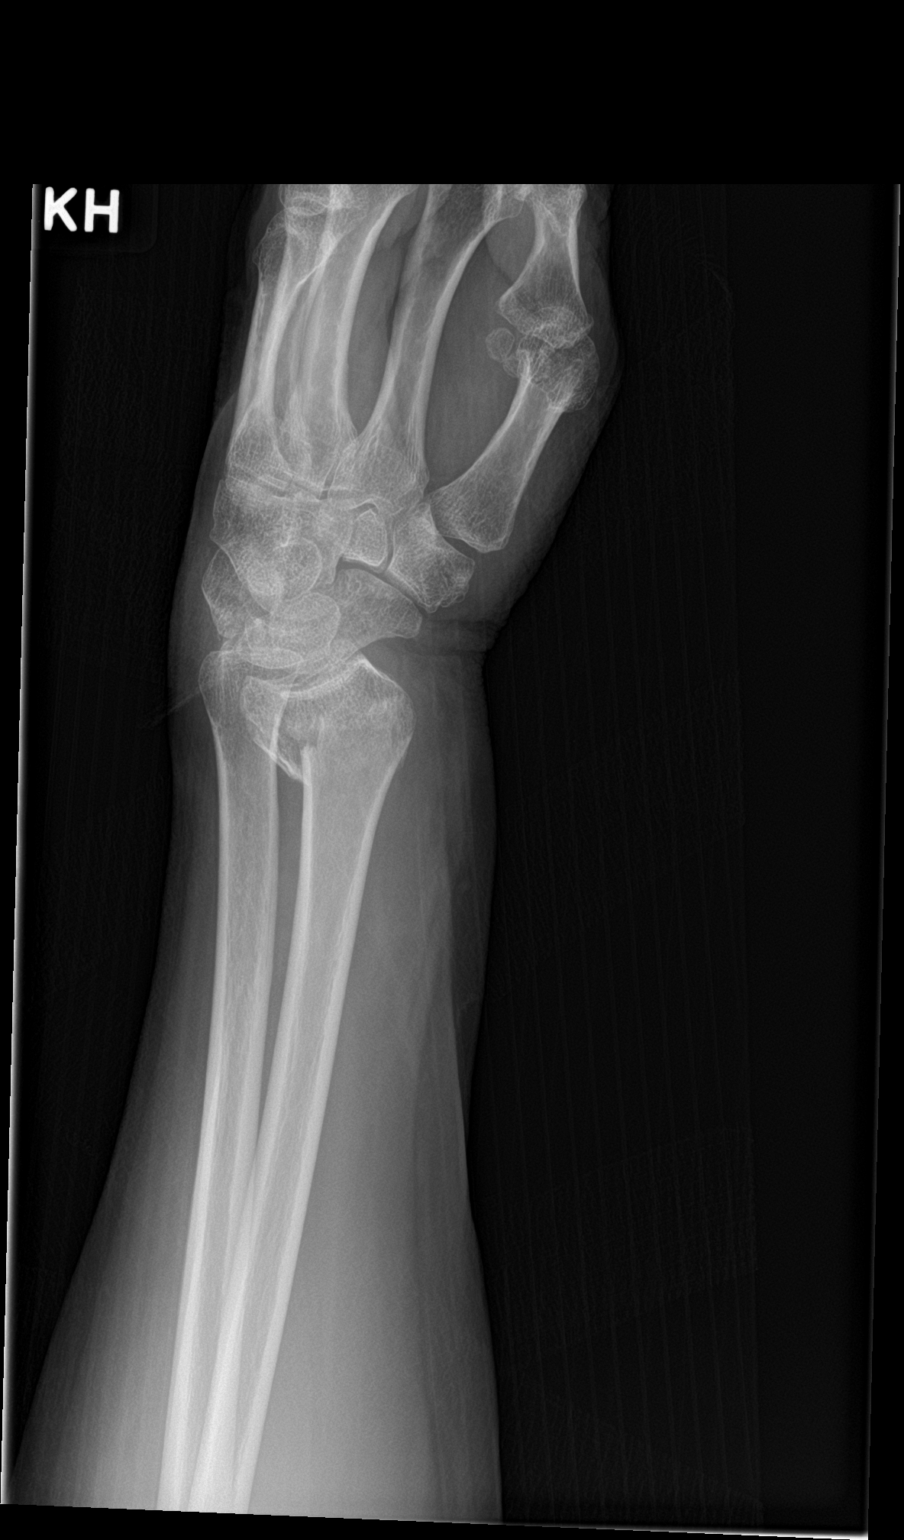

[wrist lat]
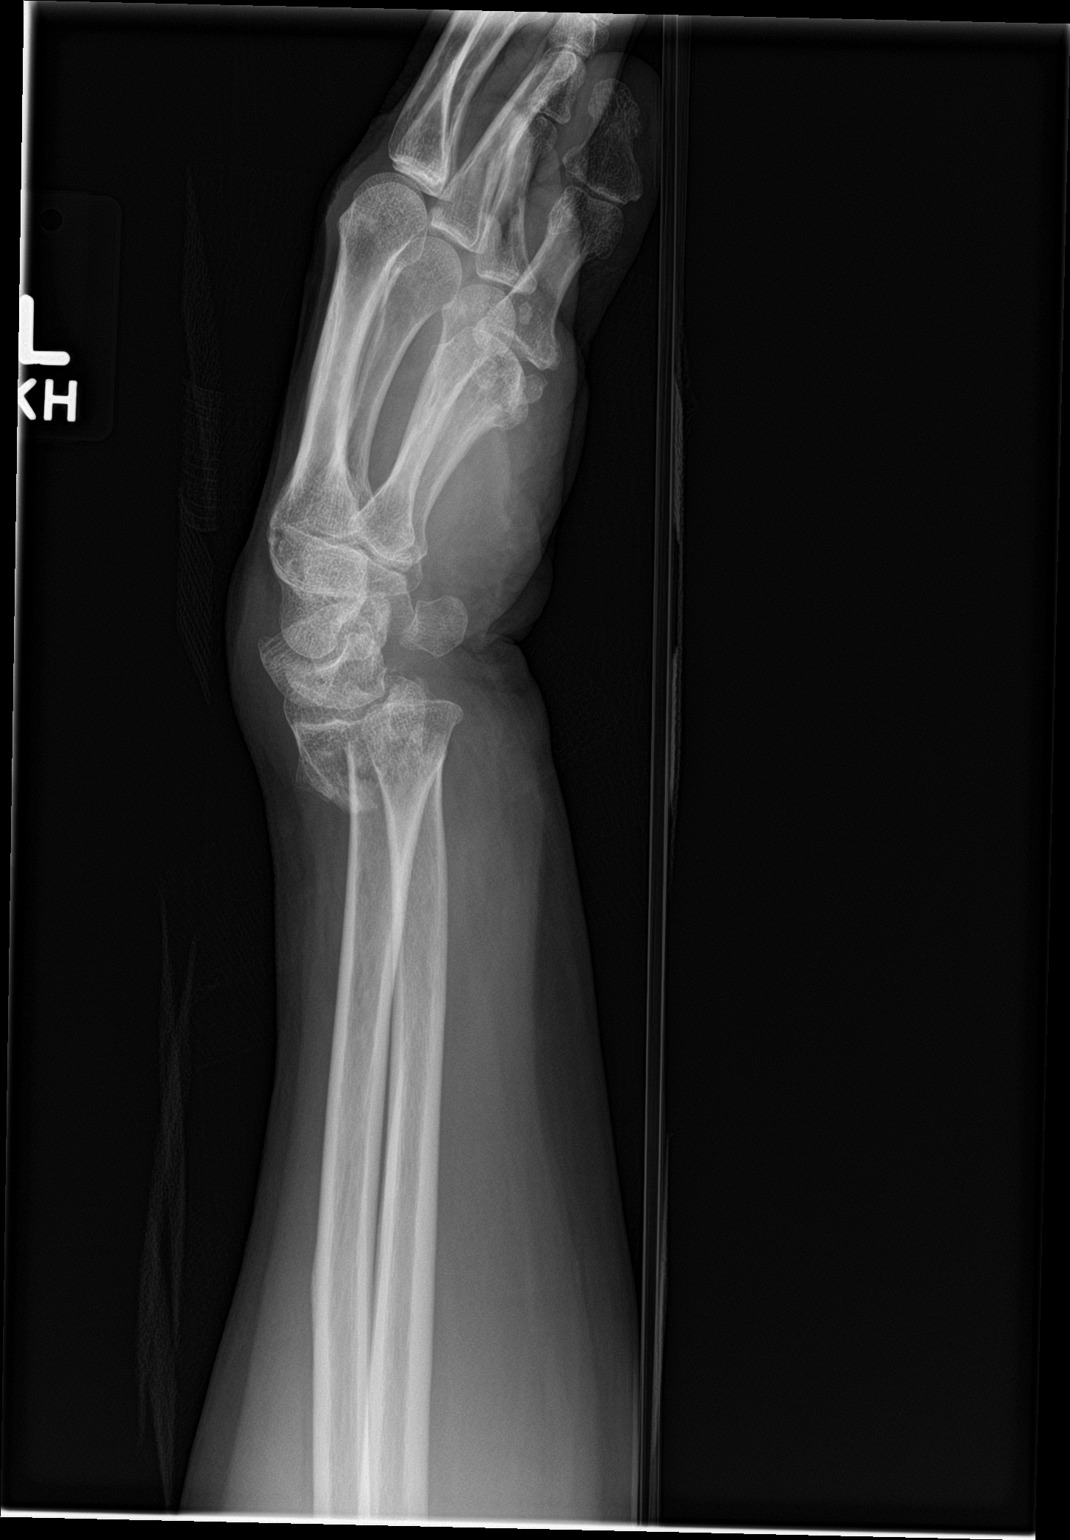

[wrist navicular]
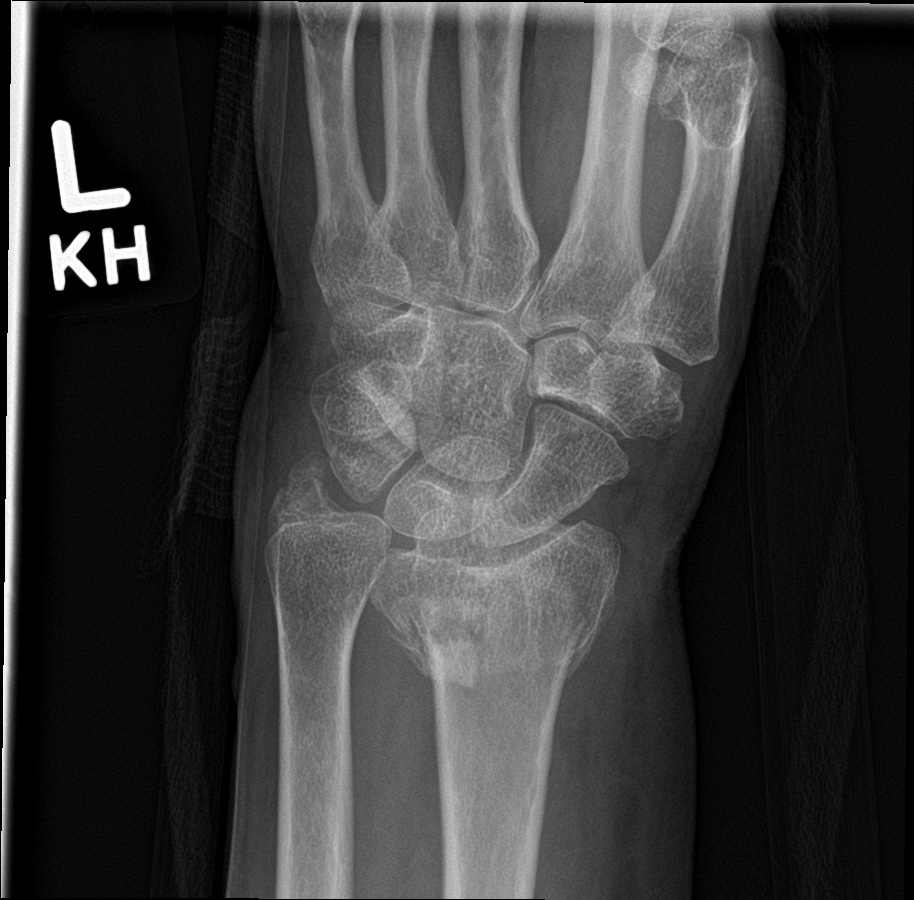

[4 of 4 positions shown; findings below may reference images not displayed]

FINDINGS: Osseous demineralization.

Transverse metaphyseal fracture distal LEFT radius with dorsal
displacement and dorsal tilt of distal radial articular surface.

Probable fracture of ulnar styloid, not significantly displaced.

Joint spaces otherwise preserved.

No additional fracture, dislocation or bone destruction.
IMPRESSION: Transverse displaced and angulated distal LEFT radial metaphyseal
fracture.

Probable nondisplaced ulnar styloid fracture.

## 2022-09-09 ENCOUNTER — Other Ambulatory Visit (HOSPITAL_COMMUNITY)
Admission: RE | Admit: 2022-09-09 | Discharge: 2022-09-09 | Disposition: A | Discharge status: Home / Self Care | Payer: Commercial Managed Care - HMO | Source: Ambulatory Visit | Attending: Nurse Practitioner | Admitting: Nurse Practitioner

## 2022-09-09 ENCOUNTER — Ambulatory Visit (INDEPENDENT_AMBULATORY_CARE_PROVIDER_SITE_OTHER): Payer: Commercial Managed Care - HMO | Admitting: Nurse Practitioner

## 2022-09-09 ENCOUNTER — Encounter: Payer: Self-pay | Admitting: Nurse Practitioner

## 2022-09-09 VITALS — BP 120/80 | HR 82 | Temp 97.5°F | Ht 63.0 in | Wt 110.0 lb

## 2022-09-09 DIAGNOSIS — Z0000 Encounter for general adult medical examination without abnormal findings: Secondary | ICD-10-CM

## 2022-09-09 DIAGNOSIS — Z23 Encounter for immunization: Secondary | ICD-10-CM | POA: Diagnosis not present

## 2022-09-09 DIAGNOSIS — Z114 Encounter for screening for human immunodeficiency virus [HIV]: Secondary | ICD-10-CM

## 2022-09-09 DIAGNOSIS — R7301 Impaired fasting glucose: Secondary | ICD-10-CM | POA: Diagnosis not present

## 2022-09-09 DIAGNOSIS — I8391 Asymptomatic varicose veins of right lower extremity: Secondary | ICD-10-CM

## 2022-09-09 DIAGNOSIS — Z13220 Encounter for screening for lipoid disorders: Secondary | ICD-10-CM | POA: Diagnosis not present

## 2022-09-09 DIAGNOSIS — Z1159 Encounter for screening for other viral diseases: Secondary | ICD-10-CM

## 2022-09-09 DIAGNOSIS — Z124 Encounter for screening for malignant neoplasm of cervix: Secondary | ICD-10-CM | POA: Diagnosis present

## 2022-09-09 DIAGNOSIS — Z1211 Encounter for screening for malignant neoplasm of colon: Secondary | ICD-10-CM

## 2022-09-09 DIAGNOSIS — R030 Elevated blood-pressure reading, without diagnosis of hypertension: Secondary | ICD-10-CM

## 2022-09-09 DIAGNOSIS — Z1231 Encounter for screening mammogram for malignant neoplasm of breast: Secondary | ICD-10-CM

## 2022-09-09 LAB — CBC WITH DIFFERENTIAL/PLATELET
Basophils Absolute: 0 10*3/uL (ref 0.0–0.1)
Basophils Relative: 0.8 % (ref 0.0–3.0)
Eosinophils Absolute: 0 10*3/uL (ref 0.0–0.7)
Eosinophils Relative: 0.4 % (ref 0.0–5.0)
HCT: 38.7 % (ref 36.0–46.0)
Hemoglobin: 13 g/dL (ref 12.0–15.0)
Lymphocytes Relative: 28.8 % (ref 12.0–46.0)
Lymphs Abs: 1.5 10*3/uL (ref 0.7–4.0)
MCHC: 33.5 g/dL (ref 30.0–36.0)
MCV: 91.3 fl (ref 78.0–100.0)
Monocytes Absolute: 0.5 10*3/uL (ref 0.1–1.0)
Monocytes Relative: 8.8 % (ref 3.0–12.0)
Neutro Abs: 3.2 10*3/uL (ref 1.4–7.7)
Neutrophils Relative %: 61.2 % (ref 43.0–77.0)
Platelets: 384 10*3/uL (ref 150.0–400.0)
RBC: 4.24 Mil/uL (ref 3.87–5.11)
RDW: 12.8 % (ref 11.5–15.5)
WBC: 5.2 10*3/uL (ref 4.0–10.5)

## 2022-09-09 LAB — COMPREHENSIVE METABOLIC PANEL
ALT: 15 U/L (ref 0–35)
AST: 18 U/L (ref 0–37)
Albumin: 4.4 g/dL (ref 3.5–5.2)
Alkaline Phosphatase: 70 U/L (ref 39–117)
BUN: 19 mg/dL (ref 6–23)
CO2: 31 mEq/L (ref 19–32)
Calcium: 9.7 mg/dL (ref 8.4–10.5)
Chloride: 102 mEq/L (ref 96–112)
Creatinine, Ser: 0.62 mg/dL (ref 0.40–1.20)
GFR: 94.1 mL/min (ref >60.00)
Glucose, Bld: 92 mg/dL (ref 70–99)
Potassium: 4.3 mEq/L (ref 3.5–5.1)
Sodium: 138 mEq/L (ref 135–145)
Total Bilirubin: 0.4 mg/dL (ref 0.2–1.2)
Total Protein: 6.9 g/dL (ref 6.0–8.3)

## 2022-09-09 LAB — LIPID PANEL
Cholesterol: 253 mg/dL — ABNORMAL HIGH (ref 0–200)
HDL: 76.3 mg/dL (ref >39.00)
NonHDL: 176.92
Total CHOL/HDL Ratio: 3
Triglycerides: 230 mg/dL — ABNORMAL HIGH (ref 0.0–149.0)
VLDL: 46 mg/dL — ABNORMAL HIGH (ref 0.0–40.0)

## 2022-09-09 LAB — LDL CHOLESTEROL, DIRECT: Direct LDL: 152 mg/dL

## 2022-09-09 LAB — HEMOGLOBIN A1C: Hgb A1c MFr Bld: 5.4 % (ref 4.6–6.5)

## 2022-09-09 NOTE — Progress Notes (Signed)
New Patient Visit  BP 120/80 Comment: home reading  Pulse 82   Temp (!) 97.5 F (36.4 C) (Temporal)   Ht 5\' 3"  (1.6 m)   Wt 110 lb (49.9 kg)   SpO2 98%   BMI 19.49 kg/m    Subjective:    Patient ID: , female    DOB: 17-May-1956, 66 y.o.   MRN: [id number]  CC: Chief Complaint  Patient presents with   Establish Care    Np. Est care. Overall health assessment. No main concernd    HPI: Maria Mccann is a 66 y.o. female presents for new patient visit to establish care.  Introduced to 77 role and practice setting.  All questions answered.  Discussed provider/patient relationship and expectations.  Maria Mccann is here to establish care.  She states that she has not seen a healthcare provider in over 15 years.  She moved from [name] 6 years ago and was living there for 23 years.  She is originally from [place].  She states that she does not have any significant medical history.  She states that her blood pressure does get elevated in doctor's offices, however when she checks it at home it is normal.  Her most recent blood pressures have been 120-125/80s.  She tries to exercise routinely.  Depression and anxiety screen done:     09/09/2022    2:32 PM  Depression screen PHQ 2/9  Decreased Interest 0  Down, Depressed, Hopeless 0  PHQ - 2 Score 0  Altered sleeping 0  Tired, decreased energy 0  Change in appetite 0  Feeling bad or failure about yourself  0  Trouble concentrating 0  Moving slowly or fidgety/restless 0  Suicidal thoughts 0  PHQ-9 Score 0  Difficult doing work/chores Not difficult at all      09/09/2022    2:32 PM  GAD 7 : Generalized Anxiety Score  Nervous, Anxious, on Edge 1  Control/stop worrying 0  Worry too much - different things 0  Trouble relaxing 0  Restless 0  Easily annoyed or irritable 0  Afraid - awful might happen 0  Total GAD 7 Score 1    Past Medical History:  Diagnosis Date   Hypertension     Past  Surgical History:  Procedure Laterality Date   OPEN REDUCTION INTERNAL FIXATION (ORIF) DISTAL RADIAL FRACTURE Left 03/08/2019   Procedure: LEFT DISTAL RADIUS REPAIR / RECONSTRUCTION;  Surgeon: 05/08/2019, MD;  Location: [facility];  Service: Orthopedics;  Laterality: Left;   TUBAL LIGATION      Family History  Problem Relation Age of Onset   Diabetes Paternal Grandfather      Social History   Tobacco Use   Smoking status: Never   Smokeless tobacco: Never  Vaping Use   Vaping Use: Never used  Substance Use Topics   Alcohol use: Yes    Alcohol/week: 1.0 standard drink of alcohol    Types: 1 Glasses of wine per week   Drug use: Never    Current Outpatient Medications on File Prior to Visit  Medication Sig Dispense Refill   Ascorbic Acid (VITAMIN C PO) Take by mouth.     magnesium 30 MG tablet Take 30 mg by mouth.     Multiple Vitamins-Minerals (VITAMIN D3 COMPLETE PO) Take by mouth.     Multiple Vitamins-Minerals (ZINC PO) Take by mouth.     Nitric Acid LIQD by Does not apply route. Blood flow supplement  POTASSIUM CITRATE PO Take by mouth.     VITAMIN A PO Take by mouth.     No current facility-administered medications on file prior to visit.     Review of Systems  Constitutional: Negative.   HENT: Negative.    Eyes: Negative.   Respiratory: Negative.    Cardiovascular: Negative.   Gastrointestinal: Negative.   Genitourinary: Negative.   Musculoskeletal: Negative.   Skin: Negative.   Neurological: Negative.   Psychiatric/Behavioral: Negative.         Objective:    BP 120/80 Comment: home reading  Pulse 82   Temp (!) 97.5 F (36.4 C) (Temporal)   Ht 5\' 3"  (1.6 m)   Wt 110 lb (49.9 kg)   SpO2 98%   BMI 19.49 kg/m   Wt Readings from Last 3 Encounters:  09/09/22 110 lb (49.9 kg)  03/08/19 127 lb 13.9 oz (58 kg)  03/04/19 130 lb (59 kg)    BP Readings from Last 3 Encounters:  09/09/22 120/80  03/08/19 137/86  03/04/19 (!) 146/86     Physical Exam Vitals and nursing note reviewed. Exam conducted with a chaperone present.  Constitutional:      General: She is not in acute distress.    Appearance: Normal appearance.  HENT:     Head: Normocephalic and atraumatic.     Right Ear: Tympanic membrane, ear canal and external ear normal.     Left Ear: Tympanic membrane, ear canal and external ear normal.  Eyes:     Conjunctiva/sclera: Conjunctivae normal.  Cardiovascular:     Rate and Rhythm: Normal rate and regular rhythm.     Pulses: Normal pulses.     Heart sounds: Normal heart sounds.  Pulmonary:     Effort: Pulmonary effort is normal.     Breath sounds: Normal breath sounds.  Abdominal:     Palpations: Abdomen is soft.     Tenderness: There is no abdominal tenderness.  Genitourinary:    General: Normal vulva.     Exam position: Lithotomy position.     Labia:        Right: No rash, tenderness or lesion.        Left: No rash, tenderness or lesion.      Vagina: Normal.     Cervix: Normal.     Uterus: Normal.      Adnexa: Right adnexa normal and left adnexa normal.  Musculoskeletal:        General: Normal range of motion.     Cervical back: Normal range of motion and neck supple.     Right lower leg: No edema.     Left lower leg: No edema.  Lymphadenopathy:     Cervical: No cervical adenopathy.  Skin:    General: Skin is warm and dry.     Comments: Varicose veins to right lower extremity  Neurological:     General: No focal deficit present.     Mental Status: She is alert and oriented to person, place, and time.     Cranial Nerves: No cranial nerve deficit.     Coordination: Coordination normal.     Gait: Gait normal.  Psychiatric:        Mood and Affect: Mood normal.        Behavior: Behavior normal.        Thought Content: Thought content normal.        Judgment: Judgment normal.        Assessment & Plan:   Problem  List Items Addressed This Visit       Cardiovascular and Mediastinum    Asymptomatic varicose veins of right lower extremity    She has varicose veins noted to right lower extremity.  Discussed wearing compression socks during the day.  She would like to hold off on a referral to vascular at this point.      White coat syndrome without hypertension    She states her blood pressure always runs high in the office.  She has been checking it routinely at home and it is 120s/80s.  Continue checking blood pressure intermittently at home.  Follow-up if blood pressures are elevated greater than 140/90.      Other Visit Diagnoses     Routine general medical examination at a health care facility    -  Primary   Health maintenance reviewed and updated.  Discussed nutrition, exercise.  Check CMP, CBC.  Follow-up 1 year.   Relevant Orders   CBC with Differential/Platelet (Completed)   Comprehensive metabolic panel (Completed)   Screening for cervical cancer       Pap with HPV done today   Relevant Orders   Cytology - PAP   Screening, lipid       Screen lipid panel today   Relevant Orders   Lipid panel (Completed)   Screening for HIV (human immunodeficiency virus)       Screen for HIV   Relevant Orders   HIV Antibody (routine testing w rflx)   Encounter for hepatitis C screening test for low risk patient       Screen for hepatitis C   Relevant Orders   Hepatitis C antibody   IFG (impaired fasting glucose)       Check A1c today   Relevant Orders   Hemoglobin A1c (Completed)   Screening for colon cancer       Discussed options for colon cancer screening.  She opts for colonoscopy.  Referral placed to GI.   Relevant Orders   Ambulatory referral to Gastroenterology   Encounter for screening mammogram for malignant neoplasm of breast       Mammogram ordered   Relevant Orders   MM 3D SCREEN BREAST BILATERAL   Need for influenza vaccination       Flu vaccine given today   Relevant Orders   Flu Vaccine QUAD 6+ mos PF IM (Fluarix Quad PF) (Completed)        LABORATORY TESTING:  - Pap smear: pap done  IMMUNIZATIONS:   - Tdap: Tetanus vaccination status reviewed: last tetanus booster within 10 years. - Influenza: Administered today - Pneumovax: Not applicable - Prevnar: Not applicable - HPV: Not applicable - Zostavax vaccine: Up to date  SCREENING: -Mammogram: Ordered today  - Colonoscopy: Ordered today  - Bone Density: Not applicable  -Hearing Test: Not applicable  -Spirometry: Not applicable   PATIENT COUNSELING:   Advised to take 1 mg of folate supplement per day if capable of pregnancy.   Sexuality: Discussed sexually transmitted diseases, partner selection, use of condoms, avoidance of unintended pregnancy  and contraceptive alternatives.   Advised to avoid cigarette smoking.  I discussed with the patient that most people either abstain from alcohol or drink within safe limits (<=14/week and <=4 drinks/occasion for males, <=7/weeks and <= 3 drinks/occasion for females) and that the risk for alcohol disorders and other health effects rises proportionally with the number of drinks per week and how often a drinker exceeds daily limits.  Discussed cessation/primary prevention of drug  use and availability of treatment for abuse.   Diet: Encouraged to adjust caloric intake to maintain  or achieve ideal body weight, to reduce intake of dietary saturated fat and total fat, to limit sodium intake by avoiding high sodium foods and not adding table salt, and to maintain adequate dietary potassium and calcium preferably from fresh fruits, vegetables, and low-fat dairy products.    stressed the importance of regular exercise  Injury prevention: Discussed safety belts, safety helmets, smoke detector, smoking near bedding or upholstery.   Dental health: Discussed importance of regular tooth brushing, flossing, and dental visits.    NEXT PREVENTATIVE PHYSICAL DUE IN 1 YEAR.  Follow up plan: Return in about 1 year (around 09/10/2023) for  CPE.

## 2022-09-09 NOTE — Addendum Note (Signed)
Addended by: Rodman Pickle A on: 09/09/2022 07:04 PM   Modules accepted: Level of Service

## 2022-09-09 NOTE — Patient Instructions (Signed)
It was great to see you!  We are checking your labs today and will let you know the results via mychart/phone.   I have placed an order for screening mammogram, they should call you to schedule. If you do not hear from them in the next week, please call:  [facility] [address] [place], [address] [place], [place] [phone]  I have placed a referral to GI for colonoscopy. They will call to schedule.   Let's follow-up in 1 year, sooner if you have concerns.  If a referral was placed today, you will be contacted for an appointment. Please note that routine referrals can sometimes take up to 3-4 weeks to process. Please call our office if you haven't heard anything after this time frame.  Take care,  Rodman Pickle, NP

## 2022-09-09 NOTE — Assessment & Plan Note (Signed)
She has varicose veins noted to right lower extremity.  Discussed wearing compression socks during the day.  She would like to hold off on a referral to vascular at this point.

## 2022-09-09 NOTE — Assessment & Plan Note (Signed)
She states her blood pressure always runs high in the office.  She has been checking it routinely at home and it is 120s/80s.  Continue checking blood pressure intermittently at home.  Follow-up if blood pressures are elevated greater than 140/90.

## 2022-09-10 LAB — HIV ANTIBODY (ROUTINE TESTING W REFLEX): HIV 1&2 Ab, 4th Generation: NONREACTIVE

## 2022-09-10 LAB — HEPATITIS C ANTIBODY: Hepatitis C Ab: NONREACTIVE

## 2022-09-15 LAB — CYTOLOGY - PAP: Adequacy: ABNORMAL

## 2022-09-30 ENCOUNTER — Encounter: Payer: Self-pay | Admitting: Gastroenterology

## 2022-11-09 ENCOUNTER — Telehealth: Payer: Self-pay | Admitting: Nurse Practitioner

## 2022-11-09 NOTE — Telephone Encounter (Signed)
Pt is saying her appointment on 11/10/22 to get a mammogram has been cancelled because of her insurance. She now has oscar    [phone].Marland KitchenPlease change locations. Pt at [phone].  Also she has a colonoscopy 11/16/22. It looks like they think she has [place], she now has oscar.   Please advise pt at  [phone] [facility])

## 2022-11-10 ENCOUNTER — Ambulatory Visit: Payer: Commercial Managed Care - HMO

## 2022-11-16 ENCOUNTER — Ambulatory Visit (AMBULATORY_SURGERY_CENTER): Payer: 59

## 2022-11-16 VITALS — Ht 63.0 in | Wt 112.0 lb

## 2022-11-16 DIAGNOSIS — Z1211 Encounter for screening for malignant neoplasm of colon: Secondary | ICD-10-CM

## 2022-11-16 MED ORDER — PLENVU 140 G PO SOLR: 1.0000 | ORAL | Status: DC

## 2022-11-16 NOTE — Progress Notes (Signed)

## 2022-12-09 ENCOUNTER — Other Ambulatory Visit: Payer: Self-pay

## 2022-12-09 DIAGNOSIS — Z1231 Encounter for screening mammogram for malignant neoplasm of breast: Secondary | ICD-10-CM

## 2022-12-09 NOTE — Telephone Encounter (Signed)
Requested referral reordered to be completed at [facility]. Called patient to inform no answer LMTCB

## 2022-12-14 ENCOUNTER — Encounter: Payer: Commercial Managed Care - HMO | Admitting: Gastroenterology

## 2023-02-01 ENCOUNTER — Encounter: Payer: Commercial Managed Care - HMO | Admitting: Gastroenterology

## 2023-02-02 ENCOUNTER — Encounter: Payer: Self-pay | Admitting: *Deleted

## 2023-02-02 NOTE — Telephone Encounter (Signed)
Disregard, iI now see where she does not... sorry I was confused.

## 2023-02-02 NOTE — Telephone Encounter (Signed)
Hi, this patient has Spanish listed as language with no notes whether an interpretor is needed. Could you contact patient to ask if interpretor is needed, and if so, please re-schedule in a one hour slot.  Thanks!

## 2023-02-08 ENCOUNTER — Other Ambulatory Visit: Payer: Self-pay | Admitting: *Deleted

## 2023-02-10 ENCOUNTER — Encounter: Payer: Commercial Managed Care - HMO | Admitting: Gastroenterology

## 2023-03-15 ENCOUNTER — Ambulatory Visit (AMBULATORY_SURGERY_CENTER): Payer: 59 | Admitting: *Deleted

## 2023-03-15 VITALS — Ht 63.0 in | Wt 115.0 lb

## 2023-03-15 DIAGNOSIS — Z1211 Encounter for screening for malignant neoplasm of colon: Secondary | ICD-10-CM

## 2023-03-15 MED ORDER — NA SULFATE-K SULFATE-MG SULF 17.5-3.13-1.6 GM/177ML PO SOLN: 1.0000 | Freq: Once | ORAL | 0 refills | Status: AC

## 2023-03-15 NOTE — Progress Notes (Signed)
Pt's name and DOB verified at the beginning of the pre-visit.  Pt denies any difficulty with ambulating,sitting, laying down or rolling side to side Gave both [facility] main # and MD on call # prior to instructions.  No egg or soy allergy known to patient  No issues known to pt with past sedation with any surgeries or procedures Pt denies having issues being intubated Pt has no issues moving head neck or swallowing No FH of Malignant Hyperthermia Pt is not on diet pills Pt is not on home 02  Pt is not on blood thinners  Pt denies issues with constipation  Pt is not on dialysis Pt denise any abnormal heart rhythms  Pt denies any upcoming cardiac testing Pt encouraged to use to use Singlecare or Goodrx to reduce cost  Patient's chart reviewed by Maria Mccann CNRA prior to pre-visit and patient appropriate for the [facility].  Pre-visit completed and red dot placed by patient's name on their procedure day (on provider's schedule).  . Visit by phone Pt states weight is 115 lb Instructed pt why it is important to and  to call if they have any changes in health or new medications. Directed them to the # given and on instructions.   Pt states they will.  Instructions reviewed with pt and pt states understanding. Instructed to review again prior to procedure. Pt states they will.  Instructions sent by mail with coupon and by my chart

## 2023-03-22 ENCOUNTER — Encounter: Payer: Self-pay | Admitting: Internal Medicine

## 2023-04-05 ENCOUNTER — Ambulatory Visit: Payer: 59 | Admitting: Internal Medicine

## 2023-04-05 ENCOUNTER — Encounter: Payer: Self-pay | Admitting: Internal Medicine

## 2023-04-05 VITALS — BP 166/91 | HR 63 | Temp 98.4°F | Resp 13 | Ht 63.0 in | Wt 119.0 lb

## 2023-04-05 DIAGNOSIS — K635 Polyp of colon: Secondary | ICD-10-CM | POA: Diagnosis not present

## 2023-04-05 DIAGNOSIS — D128 Benign neoplasm of rectum: Secondary | ICD-10-CM

## 2023-04-05 DIAGNOSIS — Z1211 Encounter for screening for malignant neoplasm of colon: Secondary | ICD-10-CM | POA: Diagnosis present

## 2023-04-05 DIAGNOSIS — D123 Benign neoplasm of transverse colon: Secondary | ICD-10-CM | POA: Diagnosis not present

## 2023-04-05 DIAGNOSIS — K621 Rectal polyp: Secondary | ICD-10-CM | POA: Diagnosis not present

## 2023-04-05 MED ORDER — SODIUM CHLORIDE 0.9 % IV SOLN: 500.0000 mL | Freq: Once | INTRAVENOUS | Status: DC

## 2023-04-05 NOTE — Op Note (Signed)
[facility] Patient Name: Maria Mccann Procedure Date: 04/05/2023 9:21 AM MRN: [id number] Endoscopist: Madelyn Brunner [place] , , [id number] Age: 67 Referring MD:  Date of Birth: Apr 11, 1956 Gender: Female Account #: [id number] Procedure:                Colonoscopy Indications:              Screening for colorectal malignant neoplasm, This                            is the patient's first colonoscopy Medicines:                Monitored Anesthesia Care Procedure:                Pre-Anesthesia Assessment:                           - Prior to the procedure, a History and Physical                            was performed, and patient medications and                            allergies were reviewed. The patient's tolerance of                            previous anesthesia was also reviewed. The risks                            and benefits of the procedure and the sedation                            options and risks were discussed with the patient.                            All questions were answered, and informed consent                            was obtained. Prior Anticoagulants: The patient has                            taken no anticoagulant or antiplatelet agents. ASA                            Grade Assessment: II - A patient with mild systemic                            disease. After reviewing the risks and benefits,                            the patient was deemed in satisfactory condition to                            undergo the procedure.  After obtaining informed consent, the colonoscope                            was passed under direct vision. Throughout the                            procedure, the patient's blood pressure, pulse, and                            oxygen saturations were monitored continuously. The                            Olympus PCF-H190DL (#[id number]) Colonoscope was                            introduced through the anus  and advanced to the the                            cecum, identified by appendiceal orifice and                            ileocecal valve. The colonoscopy was performed                            without difficulty. The patient tolerated the                            procedure well. The quality of the bowel                            preparation was good. The terminal ileum, ileocecal                            valve, appendiceal orifice, and rectum were                            photographed. Scope In: 9:32:11 AM Scope Out: 9:51:13 AM Scope Withdrawal Time: 0 hours 14 minutes 45 seconds  Total Procedure Duration: 0 hours 19 minutes 2 seconds  Findings:                 The terminal ileum appeared normal.                           Four sessile polyps were found in the rectum and                            transverse colon. The polyps were 3 to 10 mm in                            size. These polyps were removed with a cold snare.                            Resection and retrieval were complete.  Multiple diverticula were found in the sigmoid                            colon.                           Non-bleeding internal hemorrhoids were found during                            retroflexion. Complications:            No immediate complications. Estimated Blood Loss:     Estimated blood loss was minimal. Impression:               - The examined portion of the ileum was normal.                           - Four 3 to 10 mm polyps in the rectum and in the                            transverse colon, removed with a cold snare.                            Resected and retrieved.                           - Diverticulosis in the sigmoid colon.                           - Non-bleeding internal hemorrhoids. Recommendation:           - Discharge patient to home (with escort).                           - Await pathology results.                           - The findings and  recommendations were discussed                            with the patient. Dr Particia Lather "[place]" [place],  04/05/2023 9:56:49 AM

## 2023-04-05 NOTE — Progress Notes (Signed)
Called to room to assist during endoscopic procedure.  Patient ID and intended procedure confirmed with present staff. Received instructions for my participation in the procedure from the performing physician.  

## 2023-04-05 NOTE — Progress Notes (Signed)
Uneventful anesthetic. Report to pacu rn. Vss. Care resumed by rn. 

## 2023-04-05 NOTE — Progress Notes (Signed)
GASTROENTEROLOGY PROCEDURE H&P NOTE   Primary Care Physician: Gerre Scull, NP    Reason for Procedure:   Colon cancer screening  Plan:    Colonoscopy  Patient is appropriate for endoscopic procedure(s) in the ambulatory ([facility]) setting.  The nature of the procedure, as well as the risks, benefits, and alternatives were carefully and thoroughly reviewed with the patient. Ample time for discussion and questions allowed. The patient understood, was satisfied, and agreed to proceed.     HPI: Maria Mccann is a 67 y.o. female who presents for colonoscopy for colon cancer screening. Denies blood in stools, changes in bowel habits, or unintentional weight loss. Denies family history of colon cancer.  Past Medical History:  Diagnosis Date   Hypertension     Past Surgical History:  Procedure Laterality Date   OPEN REDUCTION INTERNAL FIXATION (ORIF) DISTAL RADIAL FRACTURE Left 03/08/2019   Procedure: LEFT DISTAL RADIUS REPAIR / RECONSTRUCTION;  Surgeon: Bradly Bienenstock, MD;  Location: [facility];  Service: Orthopedics;  Laterality: Left;   TUBAL LIGATION      Prior to Admission medications   Medication Sig Start Date End Date Taking? Authorizing Provider  Ascorbic Acid (VITAMIN C PO) Take by mouth.   Yes [provider]  Cholecalciferol (VITAMIN D3) 10 MCG (400 UNIT) tablet Take 400 Units by mouth daily.   Yes [provider]  magnesium 30 MG tablet Take 30 mg by mouth.   Yes [provider]  Multiple Vitamins-Minerals (ZINC PO) Take by mouth.   Yes [provider]  Nitric Acid LIQD by Does not apply route. Blood flow supplement   Yes [provider]  POTASSIUM CITRATE PO Take by mouth.   Yes [provider]  VITAMIN A PO Take by mouth.   Yes [provider]  LORazepam (ATIVAN) 1 MG tablet Take by mouth. 11/10/22   [provider]    Current Outpatient Medications  Medication Sig Dispense  Refill   Ascorbic Acid (VITAMIN C PO) Take by mouth.     Cholecalciferol (VITAMIN D3) 10 MCG (400 UNIT) tablet Take 400 Units by mouth daily.     magnesium 30 MG tablet Take 30 mg by mouth.     Multiple Vitamins-Minerals (ZINC PO) Take by mouth.     Nitric Acid LIQD by Does not apply route. Blood flow supplement     POTASSIUM CITRATE PO Take by mouth.     VITAMIN A PO Take by mouth.     LORazepam (ATIVAN) 1 MG tablet Take by mouth.     Current Facility-Administered Medications  Medication Dose Route Frequency Provider Last Rate Last Admin   0.9 %  sodium chloride infusion  500 mL Intravenous Once Imogene Burn, MD        Allergies as of 04/05/2023   (No Known Allergies)    Family History  Problem Relation Age of Onset   Diabetes Paternal Grandfather    Colon cancer Neg Hx    Colon polyps Neg Hx    Esophageal cancer Neg Hx    Rectal cancer Neg Hx    Stomach cancer Neg Hx     Social History   Socioeconomic History   Marital status: Married    Spouse name: Mariana Kaufman   Number of children: 2   Years of education: Not on file   Highest education level: Not on file  Occupational History   Not on file  Tobacco Use   Smoking status: Never  Smokeless tobacco: Never  Vaping Use   Vaping Use: Never used  Substance and Sexual Activity   Alcohol use: Yes    Alcohol/week: 1.0 standard drink of alcohol    Types: 1 Glasses of wine per week   Drug use: Never   Sexual activity: Yes    Birth control/protection: Surgical, Post-menopausal  Other Topics Concern   Not on file  Social History Narrative   Not on file   Social Determinants of Health   Financial Resource Strain: Not on file  Food Insecurity: Not on file  Transportation Needs: Not on file  Physical Activity: Not on file  Stress: Not on file  Social Connections: Not on file  Intimate Partner Violence: Not on file    Physical Exam: Vital signs in last 24 hours: BP (!) 185/86   Pulse 79   Temp 98.4 F (36.9 C)    Ht 5\' 3"  (1.6 m)   Wt 119 lb (54 kg)   SpO2 99%   BMI 21.08 kg/m  GEN: NAD EYE: Sclerae anicteric ENT: MMM CV: Non-tachycardic Pulm: No increased work of breathing GI: Soft, NT/ND NEURO:  Alert & Oriented   Eulah Pont, MD [facility]  04/05/2023 8:57 AM

## 2023-04-05 NOTE — Progress Notes (Signed)
I have reviewed the patient's medical history in detail and updated the computerized patient record.

## 2023-04-05 NOTE — Patient Instructions (Signed)
Handout on hemorrhoids, diverticulosis, and polyps  Await pathology results Resume previous diet and continue present medications  Repeat colonoscopy for surveillance will be determined based off of pathology results   YOU HAD AN ENDOSCOPIC PROCEDURE TODAY AT THE [facility]:   Refer to the procedure report that was given to you for any specific questions about what was found during the examination.  If the procedure report does not answer your questions, please call your gastroenterologist to clarify.  If you requested that your care partner not be given the details of your procedure findings, then the procedure report has been included in a sealed envelope for you to review at your convenience later.  YOU SHOULD EXPECT: Some feelings of bloating in the abdomen. Passage of more gas than usual.  Walking can help get rid of the air that was put into your GI tract during the procedure and reduce the bloating. If you had a lower endoscopy (such as a colonoscopy or flexible sigmoidoscopy) you may notice spotting of blood in your stool or on the toilet paper. If you underwent a bowel prep for your procedure, you may not have a normal bowel movement for a few days.  Please Note:  You might notice some irritation and congestion in your nose or some drainage.  This is from the oxygen used during your procedure.  There is no need for concern and it should clear up in a day or so.  SYMPTOMS TO REPORT IMMEDIATELY:  Following lower endoscopy (colonoscopy or flexible sigmoidoscopy):  Excessive amounts of blood in the stool  Significant tenderness or worsening of abdominal pains  Swelling of the abdomen that is new, acute  Fever of 100F or higher   For urgent or emergent issues, a gastroenterologist can be reached at any hour by calling [phone]. Do not use MyChart messaging for urgent concerns.    DIET:  We do recommend a small meal at first, but then you may proceed to your regular  diet.  Drink plenty of fluids but you should avoid alcoholic beverages for 24 hours.  ACTIVITY:  You should plan to take it easy for the rest of today and you should NOT DRIVE or use heavy machinery until tomorrow (because of the sedation medicines used during the test).    FOLLOW UP: Our staff will call the number listed on your records the next business day following your procedure.  We will call around 7:15- 8:00 am to check on you and address any questions or concerns that you may have regarding the information given to you following your procedure. If we do not reach you, we will leave a message.     If any biopsies were taken you will be contacted by phone or by letter within the next 1-3 weeks.  Please call us at [phone] if you have not heard about the biopsies in 3 weeks.    SIGNATURES/CONFIDENTIALITY: You and/or your care partner have signed paperwork which will be entered into your electronic medical record.  These signatures attest to the fact that that the information above on your After Visit Summary has been reviewed and is understood.  Full responsibility of the confidentiality of this discharge information lies with you and/or your care-partner.

## 2023-04-06 ENCOUNTER — Telehealth: Payer: Self-pay | Admitting: *Deleted

## 2023-04-06 NOTE — Telephone Encounter (Signed)
Attempted f/u phone call. No answer. Left message. °

## 2023-04-07 ENCOUNTER — Encounter: Payer: Self-pay | Admitting: Internal Medicine

## 2023-09-13 ENCOUNTER — Ambulatory Visit: Payer: 59 | Admitting: Nurse Practitioner

## 2023-09-13 ENCOUNTER — Encounter: Payer: Self-pay | Admitting: Nurse Practitioner

## 2023-09-13 VITALS — BP 130/89 | HR 75 | Temp 97.1°F | Ht 63.0 in | Wt 122.4 lb

## 2023-09-13 DIAGNOSIS — R030 Elevated blood-pressure reading, without diagnosis of hypertension: Secondary | ICD-10-CM | POA: Diagnosis not present

## 2023-09-13 DIAGNOSIS — Z0000 Encounter for general adult medical examination without abnormal findings: Secondary | ICD-10-CM

## 2023-09-13 DIAGNOSIS — Z780 Asymptomatic menopausal state: Secondary | ICD-10-CM

## 2023-09-13 DIAGNOSIS — E782 Mixed hyperlipidemia: Secondary | ICD-10-CM | POA: Diagnosis not present

## 2023-09-13 DIAGNOSIS — I8391 Asymptomatic varicose veins of right lower extremity: Secondary | ICD-10-CM | POA: Diagnosis not present

## 2023-09-13 DIAGNOSIS — Z23 Encounter for immunization: Secondary | ICD-10-CM | POA: Diagnosis not present

## 2023-09-13 DIAGNOSIS — Z1231 Encounter for screening mammogram for malignant neoplasm of breast: Secondary | ICD-10-CM

## 2023-09-13 LAB — COMPREHENSIVE METABOLIC PANEL
ALT: 17 U/L (ref 0–35)
AST: 19 U/L (ref 0–37)
Albumin: 4.3 g/dL (ref 3.5–5.2)
Alkaline Phosphatase: 73 U/L (ref 39–117)
BUN: 17 mg/dL (ref 6–23)
CO2: 28 meq/L (ref 19–32)
Calcium: 9.3 mg/dL (ref 8.4–10.5)
Chloride: 103 meq/L (ref 96–112)
Creatinine, Ser: 0.69 mg/dL (ref 0.40–1.20)
GFR: 91.06 mL/min (ref >60.00)
Glucose, Bld: 97 mg/dL (ref 70–99)
Potassium: 4.3 meq/L (ref 3.5–5.1)
Sodium: 139 meq/L (ref 135–145)
Total Bilirubin: 0.5 mg/dL (ref 0.2–1.2)
Total Protein: 6.7 g/dL (ref 6.0–8.3)

## 2023-09-13 LAB — CBC WITH DIFFERENTIAL/PLATELET
Basophils Absolute: 0 10*3/uL (ref 0.0–0.1)
Basophils Relative: 0.6 % (ref 0.0–3.0)
Eosinophils Absolute: 0 10*3/uL (ref 0.0–0.7)
Eosinophils Relative: 0.9 % (ref 0.0–5.0)
HCT: 39.6 % (ref 36.0–46.0)
Hemoglobin: 13.3 g/dL (ref 12.0–15.0)
Lymphocytes Relative: 34.8 % (ref 12.0–46.0)
Lymphs Abs: 1.7 10*3/uL (ref 0.7–4.0)
MCHC: 33.6 g/dL (ref 30.0–36.0)
MCV: 92.9 fL (ref 78.0–100.0)
Monocytes Absolute: 0.3 10*3/uL (ref 0.1–1.0)
Monocytes Relative: 6.3 % (ref 3.0–12.0)
Neutro Abs: 2.8 10*3/uL (ref 1.4–7.7)
Neutrophils Relative %: 57.4 % (ref 43.0–77.0)
Platelets: 371 10*3/uL (ref 150.0–400.0)
RBC: 4.26 Mil/uL (ref 3.87–5.11)
RDW: 12.8 % (ref 11.5–15.5)
WBC: 4.8 10*3/uL (ref 4.0–10.5)

## 2023-09-13 LAB — LIPID PANEL
Cholesterol: 280 mg/dL — ABNORMAL HIGH (ref 0–200)
HDL: 67.8 mg/dL (ref >39.00)
LDL Cholesterol: 168 mg/dL — ABNORMAL HIGH (ref 0–99)
NonHDL: 212.02
Total CHOL/HDL Ratio: 4
Triglycerides: 219 mg/dL — ABNORMAL HIGH (ref 0.0–149.0)
VLDL: 43.8 mg/dL — ABNORMAL HIGH (ref 0.0–40.0)

## 2023-09-13 NOTE — Assessment & Plan Note (Signed)
Chronic, stable. Check CMP, CBC, lipid panel today. Continue regular exercise and limiting fried/fatty foods.   The 10-year ASCVD risk score (Arnett DK, et al., 2019) is: 5.7%   Values used to calculate the score:     Age: 67 years     Sex: Female     Is Non-Hispanic African American: No     Diabetic: No     Tobacco smoker: No     Systolic Blood Pressure: 130 mmHg     Is BP treated: No     HDL Cholesterol: 76.3 mg/dL     Total Cholesterol: 253 mg/dL

## 2023-09-13 NOTE — Progress Notes (Signed)
BP 130/89 Comment: home reading  Pulse 75   Temp (!) 97.1 F (36.2 C)   Ht 5\' 3"  (1.6 m)   Wt 122 lb 6.4 oz (55.5 kg)   SpO2 99%   BMI 21.68 kg/m    Subjective:    Patient ID: Maria Mccann, female    DOB: 08-28-1956, 67 y.o.   MRN: [id number]  CC: Chief Complaint  Patient presents with   Annual Exam    With fasting lab work, no concerns    HPI: Maria Mccann is a 67 y.o. female presenting on 09/13/2023 for comprehensive medical examination. Current medical complaints include:none  She currently lives with: husband Menopausal Symptoms: no  Depression and Anxiety Screen done today and results listed below:     09/13/2023    9:12 AM 09/09/2022    2:32 PM  Depression screen PHQ 2/9  Decreased Interest 0 0  Down, Depressed, Hopeless 0 0  PHQ - 2 Score 0 0  Altered sleeping 0 0  Tired, decreased energy 0 0  Change in appetite 0 0  Feeling bad or failure about yourself  0 0  Trouble concentrating 0 0  Moving slowly or fidgety/restless 0 0  Suicidal thoughts 0 0  PHQ-9 Score 0 0  Difficult doing work/chores Not difficult at all Not difficult at all      09/13/2023    9:12 AM 09/09/2022    2:32 PM  GAD 7 : Generalized Anxiety Score  Nervous, Anxious, on Edge 0 1  Control/stop worrying 0 0  Worry too much - different things 0 0  Trouble relaxing 0 0  Restless 0 0  Easily annoyed or irritable 0 0  Afraid - awful might happen 0 0  Total GAD 7 Score 0 1  Anxiety Difficulty Not difficult at all     The patient does not have a history of falls. I did not complete a risk assessment for falls. A plan of care for falls was not documented.   Past Medical History:  Past Medical History:  Diagnosis Date   Hypertension     Surgical History:  Past Surgical History:  Procedure Laterality Date   OPEN REDUCTION INTERNAL FIXATION (ORIF) DISTAL RADIAL FRACTURE Left 03/08/2019   Procedure: LEFT DISTAL RADIUS REPAIR / RECONSTRUCTION;  Surgeon: Bradly Bienenstock, MD;  Location:  [facility];  Service: Orthopedics;  Laterality: Left;   TUBAL LIGATION      Medications:  Current Outpatient Medications on File Prior to Visit  Medication Sig   Ascorbic Acid (VITAMIN C PO) Take by mouth.   Cholecalciferol (VITAMIN D3) 10 MCG (400 UNIT) tablet Take 400 Units by mouth daily.   magnesium 30 MG tablet Take 30 mg by mouth.   Multiple Vitamins-Minerals (ZINC PO) Take by mouth.   Nitric Acid LIQD by Does not apply route. Blood flow supplement   POTASSIUM CITRATE PO Take by mouth.   VITAMIN A PO Take by mouth.   LORazepam (ATIVAN) 1 MG tablet Take by mouth. (Patient not taking: Reported on 09/13/2023)   No current facility-administered medications on file prior to visit.    Allergies:  No Known Allergies  Social History:  Social History   Socioeconomic History   Marital status: Married    Spouse name: Mariana Kaufman   Number of children: 2   Years of education: Not on file   Highest education level: Not on file  Occupational History   Not on file  Tobacco Use   Smoking status:  Never   Smokeless tobacco: Never  Vaping Use   Vaping status: Never Used  Substance and Sexual Activity   Alcohol use: Yes    Alcohol/week: 1.0 standard drink of alcohol    Types: 1 Glasses of wine per week   Drug use: Never   Sexual activity: Yes    Birth control/protection: Surgical, Post-menopausal  Other Topics Concern   Not on file  Social History Narrative   Not on file   Social Determinants of Health   Financial Resource Strain: Not on file  Food Insecurity: Not on file  Transportation Needs: Not on file  Physical Activity: Not on file  Stress: Not on file  Social Connections: Not on file  Intimate Partner Violence: Not on file   Social History   Tobacco Use  Smoking Status Never  Smokeless Tobacco Never   Social History   Substance and Sexual Activity  Alcohol Use Yes   Alcohol/week: 1.0 standard drink of alcohol   Types: 1 Glasses of wine per week     Family History:  Family History  Problem Relation Age of Onset   Diabetes Paternal Grandfather    Colon cancer Neg Hx    Colon polyps Neg Hx    Esophageal cancer Neg Hx    Rectal cancer Neg Hx    Stomach cancer Neg Hx     Past medical history, surgical history, medications, allergies, family history and social history reviewed with patient today and changes made to appropriate areas of the chart.   Review of Systems  Constitutional: Negative.   HENT: Negative.    Eyes: Negative.   Respiratory: Negative.    Cardiovascular: Negative.   Gastrointestinal: Negative.   Genitourinary: Negative.   Musculoskeletal: Negative.   Skin:  Negative for itching and rash.       Varicose veins right leg  Neurological: Negative.   Psychiatric/Behavioral: Negative.     All other ROS negative except what is listed above and in the HPI.      Objective:    BP 130/89 Comment: home reading  Pulse 75   Temp (!) 97.1 F (36.2 C)   Ht 5\' 3"  (1.6 m)   Wt 122 lb 6.4 oz (55.5 kg)   SpO2 99%   BMI 21.68 kg/m   Wt Readings from Last 3 Encounters:  09/13/23 122 lb 6.4 oz (55.5 kg)  04/05/23 119 lb (54 kg)  03/15/23 115 lb (52.2 kg)    Physical Exam Vitals and nursing note reviewed.  Constitutional:      General: She is not in acute distress.    Appearance: Normal appearance.  HENT:     Head: Normocephalic and atraumatic.     Right Ear: Tympanic membrane, ear canal and external ear normal.     Left Ear: Tympanic membrane, ear canal and external ear normal.  Eyes:     Conjunctiva/sclera: Conjunctivae normal.  Cardiovascular:     Rate and Rhythm: Normal rate and regular rhythm.     Pulses: Normal pulses.     Heart sounds: Normal heart sounds.  Pulmonary:     Effort: Pulmonary effort is normal.     Breath sounds: Normal breath sounds.  Abdominal:     Palpations: Abdomen is soft.     Tenderness: There is no abdominal tenderness.  Musculoskeletal:        General: Normal range of  motion.     Cervical back: Normal range of motion and neck supple.     Right lower  leg: No edema.     Left lower leg: No edema.  Lymphadenopathy:     Cervical: No cervical adenopathy.  Skin:    General: Skin is warm and dry.     Comments: Varicose veins to right leg  Neurological:     General: No focal deficit present.     Mental Status: She is alert and oriented to person, place, and time.     Cranial Nerves: No cranial nerve deficit.     Coordination: Coordination normal.     Gait: Gait normal.  Psychiatric:        Mood and Affect: Mood normal.        Behavior: Behavior normal.        Thought Content: Thought content normal.        Judgment: Judgment normal.     Results for orders placed or performed in visit on 09/09/22  CBC with Differential/Platelet  Result Value Ref Range   WBC 5.2 4.0 - 10.5 K/uL   RBC 4.24 3.87 - 5.11 Mil/uL   Hemoglobin 13.0 12.0 - 15.0 g/dL   HCT 30.8 65.7 - 84.6 %   MCV 91.3 78.0 - 100.0 fl   MCHC 33.5 30.0 - 36.0 g/dL   RDW 96.2 95.2 - 84.1 %   Platelets 384.0 150.0 - 400.0 K/uL   Neutrophils Relative % 61.2 43.0 - 77.0 %   Lymphocytes Relative 28.8 12.0 - 46.0 %   Monocytes Relative 8.8 3.0 - 12.0 %   Eosinophils Relative 0.4 0.0 - 5.0 %   Basophils Relative 0.8 0.0 - 3.0 %   Neutro Abs 3.2 1.4 - 7.7 K/uL   Lymphs Abs 1.5 0.7 - 4.0 K/uL   Monocytes Absolute 0.5 0.1 - 1.0 K/uL   Eosinophils Absolute 0.0 0.0 - 0.7 K/uL   Basophils Absolute 0.0 0.0 - 0.1 K/uL  Comprehensive metabolic panel  Result Value Ref Range   Sodium 138 135 - 145 mEq/L   Potassium 4.3 3.5 - 5.1 mEq/L   Chloride 102 96 - 112 mEq/L   CO2 31 19 - 32 mEq/L   Glucose, Bld 92 70 - 99 mg/dL   BUN 19 6 - 23 mg/dL   Creatinine, Ser 3.24 0.40 - 1.20 mg/dL   Total Bilirubin 0.4 0.2 - 1.2 mg/dL   Alkaline Phosphatase 70 39 - 117 U/L   AST 18 0 - 37 U/L   ALT 15 0 - 35 U/L   Total Protein 6.9 6.0 - 8.3 g/dL   Albumin 4.4 3.5 - 5.2 g/dL   GFR 40.10 >27.25 mL/min   Calcium  9.7 8.4 - 10.5 mg/dL  Hemoglobin D6U  Result Value Ref Range   Hgb A1c MFr Bld 5.4 4.6 - 6.5 %  Lipid panel  Result Value Ref Range   Cholesterol 253 (H) 0 - 200 mg/dL   Triglycerides 440.3 (H) 0.0 - 149.0 mg/dL   HDL 47.42 >59.56 mg/dL   VLDL 38.7 (H) 0.0 - 56.4 mg/dL   Total CHOL/HDL Ratio 3    NonHDL 176.92   HIV Antibody (routine testing w rflx)  Result Value Ref Range   HIV 1&2 Ab, 4th Generation NON-REACTIVE NON-REACTIVE  Hepatitis C antibody  Result Value Ref Range   Hepatitis C Ab NON-REACTIVE NON-REACTIVE  LDL cholesterol, direct  Result Value Ref Range   Direct LDL 152.0 mg/dL  Cytology - PAP  Result Value Ref Range   High risk HPV Other    Adequacy      UNSATISFACTORY  for evaluation due to extremely scant cellularity. The   Adequacy      specimen is processed and examined microscopically, but is found to be   Adequacy      unsatisfactory for evaluation of an epithelial abnormality. Repeat study   Adequacy recommended.    Diagnosis - Non-diagnostic (A)    Molecular Comment Recommend recollection and testing.       Assessment & Plan:   Problem List Items Addressed This Visit       Cardiovascular and Mediastinum   Asymptomatic varicose veins of right lower extremity    She has varicose veins noted to right lower extremity.  Discussed wearing compression socks during the day.  Referral placed to vascular.       Relevant Orders   Ambulatory referral to Vascular Surgery   White coat syndrome without hypertension    She states her blood pressure always runs high in the office.  She has been checking it routinely at home and it is 120-130s/80s.  Continue checking blood pressure intermittently at home.  Follow-up if blood pressures are elevated greater than 140/90.        Other   Routine general medical examination at a health care facility - Primary    Health maintenance reviewed and updated. Discussed nutrition, exercise. Check CMP, CBC today. Follow-up 1  year.        Mixed hyperlipidemia    Chronic, stable. Check CMP, CBC, lipid panel today. Continue regular exercise and limiting fried/fatty foods.   The 10-year ASCVD risk score (Arnett DK, et al., 2019) is: 5.7%   Values used to calculate the score:     Age: 89 years     Sex: Female     Is Non-Hispanic African American: No     Diabetic: No     Tobacco smoker: No     Systolic Blood Pressure: 130 mmHg     Is BP treated: No     HDL Cholesterol: 76.3 mg/dL     Total Cholesterol: 253 mg/dL       Relevant Orders   CBC with Differential/Platelet   Comprehensive metabolic panel   Lipid panel   Other Visit Diagnoses     Encounter for screening mammogram for malignant neoplasm of breast       Mammogram ordered today   Relevant Orders   MM 3D SCREENING MAMMOGRAM BILATERAL BREAST   Postmenopausal estrogen deficiency       DEXA scan ordered today   Relevant Orders   DG Bone Density   Immunization due       Prevnar 20 given today   Relevant Orders   Pneumococcal conjugate vaccine 20-valent        Follow up plan: Return in about 1 year (around 09/12/2024) for CPE.   LABORATORY TESTING:  - Pap smear:  declined today  IMMUNIZATIONS:   - Tdap: Tetanus vaccination status reviewed: last tetanus booster within 10 years. - Influenza: Up to date - Pneumovax: Not applicable - Prevnar: Administered today - HPV: Not applicable - Shingrix vaccine: Up to date  SCREENING: -Mammogram: Ordered today  - Colonoscopy: Up to date  - Bone Density: Ordered today   PATIENT COUNSELING:   Advised to take 1 mg of folate supplement per day if capable of pregnancy.   Sexuality: Discussed sexually transmitted diseases, partner selection, use of condoms, avoidance of unintended pregnancy  and contraceptive alternatives.   Advised to avoid cigarette smoking.  I discussed with the patient that most people either  abstain from alcohol or drink within safe limits (<=14/week and <=4  drinks/occasion for males, <=7/weeks and <= 3 drinks/occasion for females) and that the risk for alcohol disorders and other health effects rises proportionally with the number of drinks per week and how often a drinker exceeds daily limits.  Discussed cessation/primary prevention of drug use and availability of treatment for abuse.   Diet: Encouraged to adjust caloric intake to maintain  or achieve ideal body weight, to reduce intake of dietary saturated fat and total fat, to limit sodium intake by avoiding high sodium foods and not adding table salt, and to maintain adequate dietary potassium and calcium preferably from fresh fruits, vegetables, and low-fat dairy products.    stressed the importance of regular exercise  Injury prevention: Discussed safety belts, safety helmets, smoke detector, smoking near bedding or upholstery.   Dental health: Discussed importance of regular tooth brushing, flossing, and dental visits.    NEXT PREVENTATIVE PHYSICAL DUE IN 1 YEAR. Return in about 1 year (around 09/12/2024) for CPE.  Darcee Dekker A Yamilett Anastos

## 2023-09-13 NOTE — Assessment & Plan Note (Signed)
She states her blood pressure always runs high in the office.  She has been checking it routinely at home and it is 120-130s/80s.  Continue checking blood pressure intermittently at home.  Follow-up if blood pressures are elevated greater than 140/90.

## 2023-09-13 NOTE — Patient Instructions (Signed)
It was great to see you!  We are checking your labs today and will let you know the results via mychart/phone.   I have ordered a mammogram and bone scan (dexa), they will call to schedule.   Let's follow-up in 1 year, sooner if you have concerns.  If a referral was placed today, you will be contacted for an appointment. Please note that routine referrals can sometimes take up to 3-4 weeks to process. Please call our office if you haven't heard anything after this time frame.  Take care,  Rodman Pickle, NP

## 2023-09-13 NOTE — Assessment & Plan Note (Signed)
She has varicose veins noted to right lower extremity.  Discussed wearing compression socks during the day.  Referral placed to vascular.

## 2023-09-13 NOTE — Assessment & Plan Note (Signed)
Health maintenance reviewed and updated. Discussed nutrition, exercise. Check CMP, CBC today. Follow-up 1 year.   

## 2023-09-20 ENCOUNTER — Telehealth: Payer: Self-pay | Admitting: Nurse Practitioner

## 2023-09-20 ENCOUNTER — Ambulatory Visit (HOSPITAL_BASED_OUTPATIENT_CLINIC_OR_DEPARTMENT_OTHER)
Admission: RE | Admit: 2023-09-20 | Discharge: 2023-09-20 | Disposition: A | Discharge status: Home / Self Care | Payer: 59 | Source: Ambulatory Visit | Attending: Nurse Practitioner | Admitting: Nurse Practitioner

## 2023-09-20 ENCOUNTER — Encounter (HOSPITAL_BASED_OUTPATIENT_CLINIC_OR_DEPARTMENT_OTHER): Payer: Self-pay

## 2023-09-20 DIAGNOSIS — Z780 Asymptomatic menopausal state: Secondary | ICD-10-CM | POA: Diagnosis present

## 2023-09-20 DIAGNOSIS — Z1231 Encounter for screening mammogram for malignant neoplasm of breast: Secondary | ICD-10-CM | POA: Diagnosis present

## 2023-09-20 DIAGNOSIS — R928 Other abnormal and inconclusive findings on diagnostic imaging of breast: Secondary | ICD-10-CM

## 2023-09-20 NOTE — Telephone Encounter (Signed)
Patient returned call and wants a copy of recent lab work and also would like to get results of her bone density test. She said while she was there she also had a mammogram.  I will print out lab results.

## 2023-09-20 NOTE — Telephone Encounter (Signed)
Pt would like a call to discuss her labs and a copy   Maria Mccann [phone]

## 2023-09-20 NOTE — Telephone Encounter (Signed)
I tried to call patient and unable to leave a message due to mailbox full.

## 2023-09-21 NOTE — Telephone Encounter (Signed)
Patient notified of results and below message and I printed off test results-labs and dexa scan and put in folder at front desk.

## 2023-09-23 ENCOUNTER — Other Ambulatory Visit: Payer: Self-pay | Admitting: Nurse Practitioner

## 2023-09-23 DIAGNOSIS — R928 Other abnormal and inconclusive findings on diagnostic imaging of breast: Secondary | ICD-10-CM

## 2023-09-27 NOTE — Telephone Encounter (Signed)
Pt Is wanting a cb concerning her most recent lab results. She is having a hard time with mychart. Pt at   [phone] [facility])

## 2023-09-28 NOTE — Telephone Encounter (Signed)
LVM for patient to return call.  I printed out patient's mammogram report to go with additional imaging report and labs for pick up at the front desk.

## 2023-09-28 NOTE — Telephone Encounter (Signed)
I called and spoke with patient and notified her of mammogram results because she did not understand her results on Mychart. Patient said that when she called to schedule additional images from mammogram they told at the imaging center that they do not accept her insurance, Therefore patient would like to be referred somewhere that takes her insurance so additional scans/imaging can be done.

## 2023-09-29 ENCOUNTER — Encounter: Payer: Self-pay | Admitting: Nurse Practitioner

## 2023-09-29 NOTE — Addendum Note (Signed)
Addended by: Rodman Pickle A on: 09/29/2023 08:07 AM   Modules accepted: Orders

## 2023-09-29 NOTE — Telephone Encounter (Signed)
LVM for patient to return call. 

## 2023-10-04 NOTE — Telephone Encounter (Signed)
Patient notified of below message and will call back on Friday if she has not heard from anyone regarding referral.

## 2023-10-07 ENCOUNTER — Ambulatory Visit: Payer: 59

## 2023-10-12 NOTE — Telephone Encounter (Signed)
Pt called to check up on her referral.

## 2023-10-12 NOTE — Telephone Encounter (Signed)
I called and spoke with patient and gave her the phone number to [facility] where she was referred to.  P. [phone].

## 2023-10-21 ENCOUNTER — Ambulatory Visit: Payer: Self-pay | Admitting: Hematology and Oncology

## 2023-10-21 ENCOUNTER — Ambulatory Visit: Payer: 59

## 2023-10-21 VITALS — BP 180/102 | Wt 124.0 lb

## 2023-10-21 DIAGNOSIS — N6489 Other specified disorders of breast: Secondary | ICD-10-CM

## 2023-10-21 NOTE — Patient Instructions (Signed)
Taught Maria Mccann about self breast awareness and gave educational materials to take home. Patient did not need a Pap smear today due to last Pap smear was in 09/09/2022 per patient. Let her know [facility] will cover Pap smears every 5 years unless has a history of abnormal Pap smears. Referred patient to the [facility] for diagnostic mammogram. Appointment scheduled for 11/03/2022. Patient aware of appointment and will be there. Let patient know will follow up with her within the next couple weeks with results. Maria Mccann verbalized understanding.  Pascal Lux, NP 2:08 PM

## 2023-10-21 NOTE — Progress Notes (Signed)
 Ms. Maria Mccann is a 67 y.o. female who presents to [facility] clinic today with no complaints. Follow up possible asymmetry in right breast.    Pap Smear: Pap not smear completed today. Last Pap smear was 09/09/2022 and was  Unsatisfactory . Per patient has no history of an abnormal Pap smear. Last Pap smear result is available in Epic. Declines Pap smear. Was told they were no longer necessary.    Physical exam: Breasts Breasts symmetrical. No skin abnormalities bilateral breasts. No nipple retraction bilateral breasts. No nipple discharge bilateral breasts. No lymphadenopathy. No lumps palpated bilateral breasts.   MM 3D SCREENING MAMMOGRAM BILATERAL BREAST Result Date: 09/22/2023 CLINICAL DATA:  Screening. EXAM: DIGITAL SCREENING BILATERAL MAMMOGRAM WITH TOMOSYNTHESIS AND CAD TECHNIQUE: Bilateral screening digital craniocaudal and mediolateral oblique mammograms were obtained. Bilateral screening digital breast tomosynthesis was performed. The images were evaluated with computer-aided detection. COMPARISON:  None. ACR Breast Density Category c: The breasts are heterogeneously dense, which may obscure small masses. FINDINGS: In the right breast a possible asymmetry requires further evaluation. In the left breast 2 possible asymmetries requires further evaluation. IMPRESSION: Further evaluation is suggested for possible asymmetry in the right breast. Further evaluation is suggested for 2 possible asymmetries in the left breast. RECOMMENDATION: Diagnostic mammogram and possibly ultrasound of both breasts. (Code:[id number]) The patient will be contacted regarding the findings, and additional imaging will be scheduled. BI-RADS CATEGORY  0: Incomplete: Need additional imaging evaluation. Electronically Signed   By: Elberta Fortis M.D.   On: 09/22/2023 13:23        Pelvic/Bimanual Pap is not indicated today    Smoking History: Patient has never smoked and was not referred to quit line.    Patient  Navigation: Patient education provided. Access to services provided for patient through [facility] program. No interpreter provided. No transportation provided   Colorectal Cancer Screening: Per patient had colonoscopy 04/05/2023 - fragments of hyperplastic/serrated polyp with some features of both a sessile serrated lesion versus a hyperplastic polyp with prolapse type change.  No complaints today.    Breast and Cervical Cancer Risk Assessment: Patient does not have family history of breast cancer, known genetic mutations, or radiation treatment to the chest before age 36. Patient does not have history of cervical dysplasia, immunocompromised, or DES exposure in-utero.  Risk Scores as of Encounter on 10/21/2023     Maria Mccann           5-year 2.29%   Lifetime 8.56%   This patient is [place]/Latina but has no documented birth country, so the [place] model used data from [place] patients to calculate their risk score. Document a birth country in the Demographics activity for a more accurate score.         Last calculated by Caprice Red, CMA on 10/21/2023 at  1:55 PM        A: [facility] exam without pap smear No complaints with benign exam. Follow up possible right breast asymmetry.   P: Referred patient to the [facility] for a diagnostic mammogram. Appointment scheduled 11/03/2022.  Ilda Basset A, NP 10/21/2023 2:03 PM

## 2023-11-04 ENCOUNTER — Ambulatory Visit: Payer: 59

## 2023-11-04 ENCOUNTER — Ambulatory Visit
Admission: RE | Admit: 2023-11-04 | Discharge: 2023-11-04 | Disposition: A | Discharge status: Home / Self Care | Payer: No Typology Code available for payment source | Source: Ambulatory Visit | Attending: Nurse Practitioner | Admitting: Nurse Practitioner

## 2023-11-04 DIAGNOSIS — R928 Other abnormal and inconclusive findings on diagnostic imaging of breast: Secondary | ICD-10-CM
